# Patient Record
Sex: Male | Born: 1940 | Race: White | Hispanic: No | Marital: Married | Smoking: Former smoker
Health system: Southern US, Community
[De-identification: ages and names within clinical notes are randomized; demographics above are authoritative.]

## PROBLEM LIST (undated history)

## (undated) DIAGNOSIS — I10 Essential (primary) hypertension: Secondary | ICD-10-CM

## (undated) DIAGNOSIS — N189 Chronic kidney disease, unspecified: Secondary | ICD-10-CM

## (undated) DIAGNOSIS — E119 Type 2 diabetes mellitus without complications: Secondary | ICD-10-CM

## (undated) HISTORY — PX: LITHOTRIPSY: SUR834

---

## 2004-01-29 ENCOUNTER — Ambulatory Visit: Payer: Self-pay | Admitting: Family Medicine

## 2004-03-14 ENCOUNTER — Ambulatory Visit: Payer: Self-pay | Admitting: Family Medicine

## 2004-03-17 ENCOUNTER — Ambulatory Visit: Payer: Self-pay | Admitting: Family Medicine

## 2004-03-27 ENCOUNTER — Ambulatory Visit: Payer: Self-pay | Admitting: Family Medicine

## 2004-09-04 ENCOUNTER — Ambulatory Visit: Payer: Self-pay | Admitting: Family Medicine

## 2004-12-08 ENCOUNTER — Ambulatory Visit: Payer: Self-pay | Admitting: Family Medicine

## 2005-02-11 ENCOUNTER — Ambulatory Visit: Payer: Self-pay | Admitting: Family Medicine

## 2005-02-16 HISTORY — PX: CYSTOSCOPY/RETROGRADE/URETEROSCOPY/STONE EXTRACTION WITH BASKET: SHX5317

## 2005-09-14 ENCOUNTER — Ambulatory Visit: Payer: Self-pay | Admitting: Family Medicine

## 2005-09-16 ENCOUNTER — Ambulatory Visit (HOSPITAL_COMMUNITY): Admission: RE | Admit: 2005-09-16 | Discharge: 2005-09-16 | Payer: Self-pay | Admitting: Family Medicine

## 2005-09-21 ENCOUNTER — Ambulatory Visit (HOSPITAL_BASED_OUTPATIENT_CLINIC_OR_DEPARTMENT_OTHER): Admission: RE | Admit: 2005-09-21 | Discharge: 2005-09-21 | Payer: Self-pay | Admitting: Urology

## 2005-09-24 ENCOUNTER — Ambulatory Visit (HOSPITAL_COMMUNITY): Admission: RE | Admit: 2005-09-24 | Discharge: 2005-09-24 | Payer: Self-pay | Admitting: Urology

## 2005-10-14 ENCOUNTER — Ambulatory Visit: Payer: Self-pay | Admitting: Family Medicine

## 2005-10-20 ENCOUNTER — Ambulatory Visit (HOSPITAL_COMMUNITY): Admission: RE | Admit: 2005-10-20 | Discharge: 2005-10-20 | Payer: Self-pay | Admitting: Family Medicine

## 2005-10-20 ENCOUNTER — Ambulatory Visit: Payer: Self-pay | Admitting: Family Medicine

## 2006-01-29 ENCOUNTER — Ambulatory Visit: Payer: Self-pay | Admitting: Cardiology

## 2006-03-26 ENCOUNTER — Ambulatory Visit: Payer: Self-pay | Admitting: Family Medicine

## 2006-06-22 ENCOUNTER — Ambulatory Visit: Payer: Self-pay | Admitting: Family Medicine

## 2006-09-07 ENCOUNTER — Ambulatory Visit (HOSPITAL_BASED_OUTPATIENT_CLINIC_OR_DEPARTMENT_OTHER): Admission: RE | Admit: 2006-09-07 | Discharge: 2006-09-07 | Payer: Self-pay | Admitting: Urology

## 2006-09-09 ENCOUNTER — Ambulatory Visit (HOSPITAL_COMMUNITY): Admission: RE | Admit: 2006-09-09 | Discharge: 2006-09-09 | Payer: Self-pay | Admitting: Urology

## 2006-10-14 ENCOUNTER — Ambulatory Visit (HOSPITAL_COMMUNITY): Admission: RE | Admit: 2006-10-14 | Discharge: 2006-10-14 | Payer: Self-pay | Admitting: Urology

## 2006-11-01 ENCOUNTER — Encounter: Admission: RE | Admit: 2006-11-01 | Discharge: 2006-11-01 | Payer: Self-pay | Admitting: Family Medicine

## 2006-11-05 ENCOUNTER — Ambulatory Visit (HOSPITAL_COMMUNITY): Admission: RE | Admit: 2006-11-05 | Discharge: 2006-11-05 | Payer: Self-pay | Admitting: Urology

## 2006-11-08 ENCOUNTER — Ambulatory Visit (HOSPITAL_BASED_OUTPATIENT_CLINIC_OR_DEPARTMENT_OTHER): Admission: RE | Admit: 2006-11-08 | Discharge: 2006-11-09 | Payer: Self-pay | Admitting: Urology

## 2008-03-05 IMAGING — CR DG ABDOMEN 1V
1 series · 1 of 1 positions shown · non-contrast
Comparison: CT dated 09/16/05.

CLINICAL DATA: Right-sided nephrolithiasis.  
 ABDOMEN ? 1 VIEW:

[view not recorded]
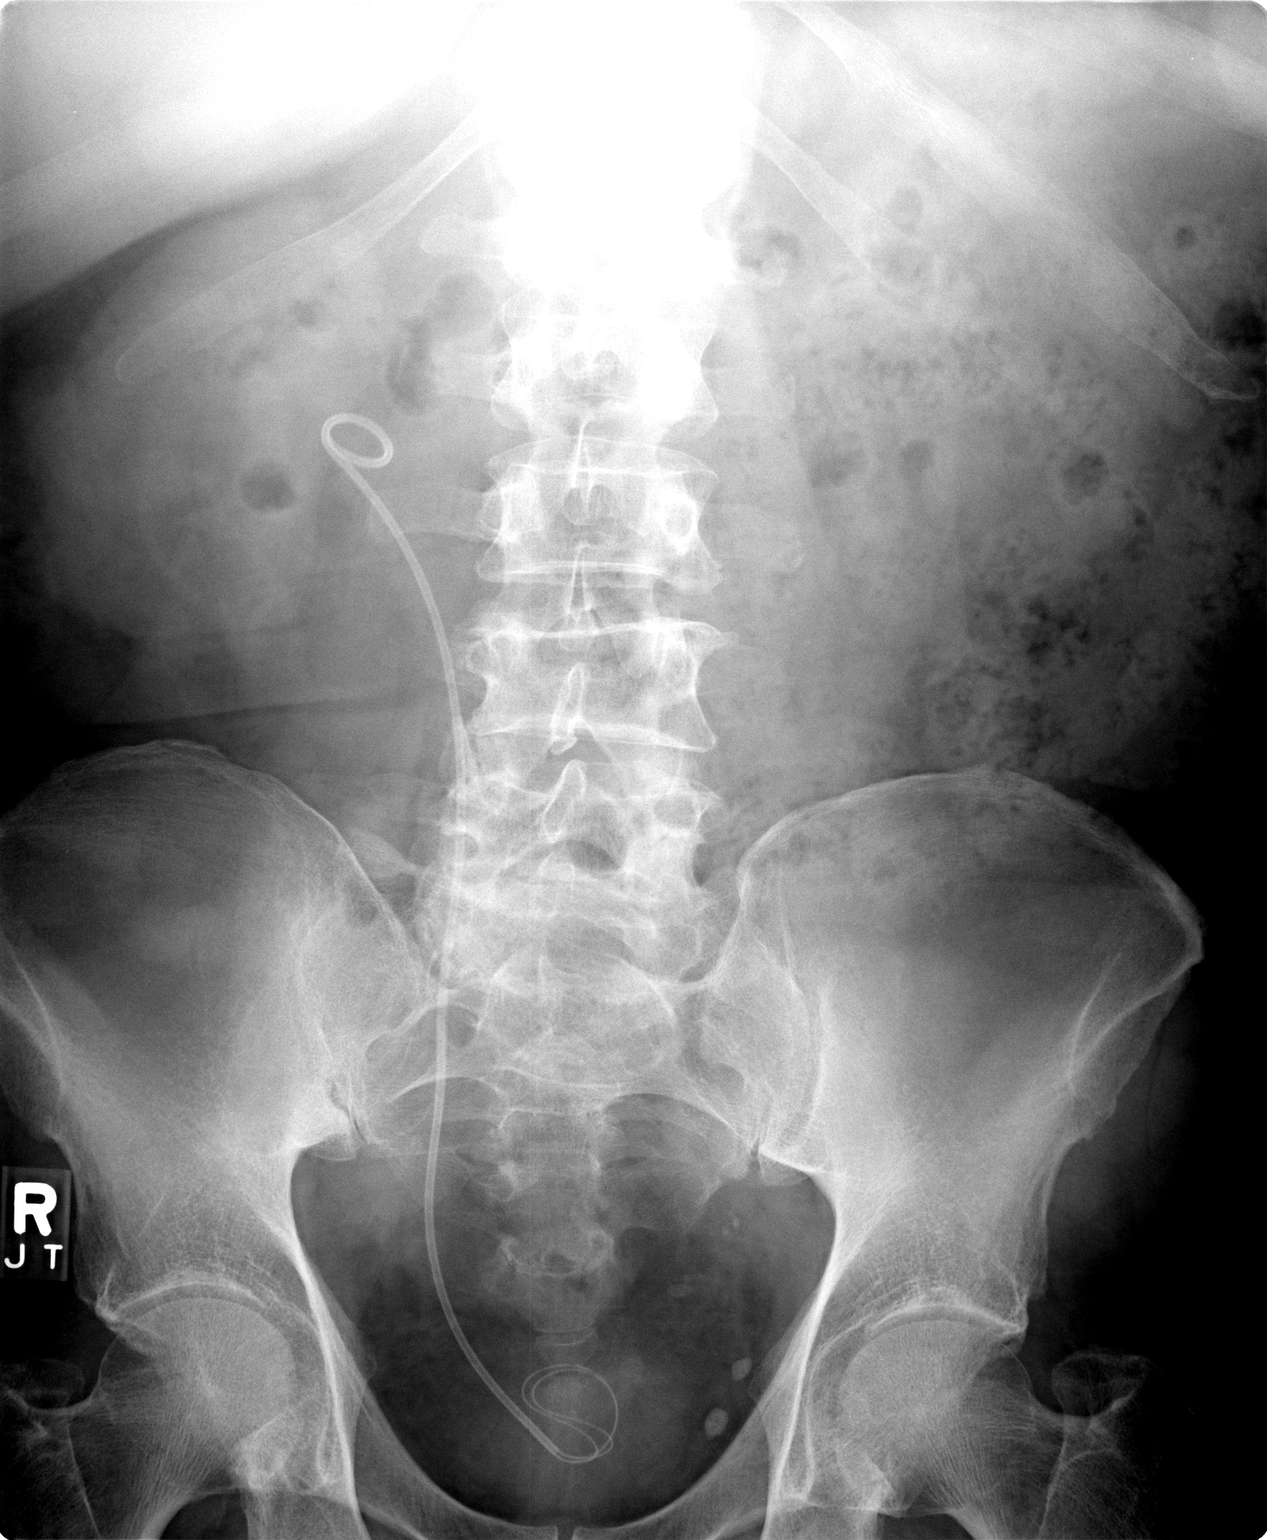

[1 of 1 positions shown; findings below may reference images not displayed]

FINDINGS: There is a right-sided nephroureteral stent in place.  
 The obstructing right ureteral stone is not identified on today?s examination.
IMPRESSION: Interval placement of right nephroureteral stent.

## 2009-03-25 IMAGING — CR DG ABDOMEN 1V
1 series · 1 of 1 positions shown · non-contrast
Comparison: 09/09/06.

CLINICAL DATA: Preop for right renal stone.  
 ABDOMEN ? 1 VIEW:

[t abdomen supine]
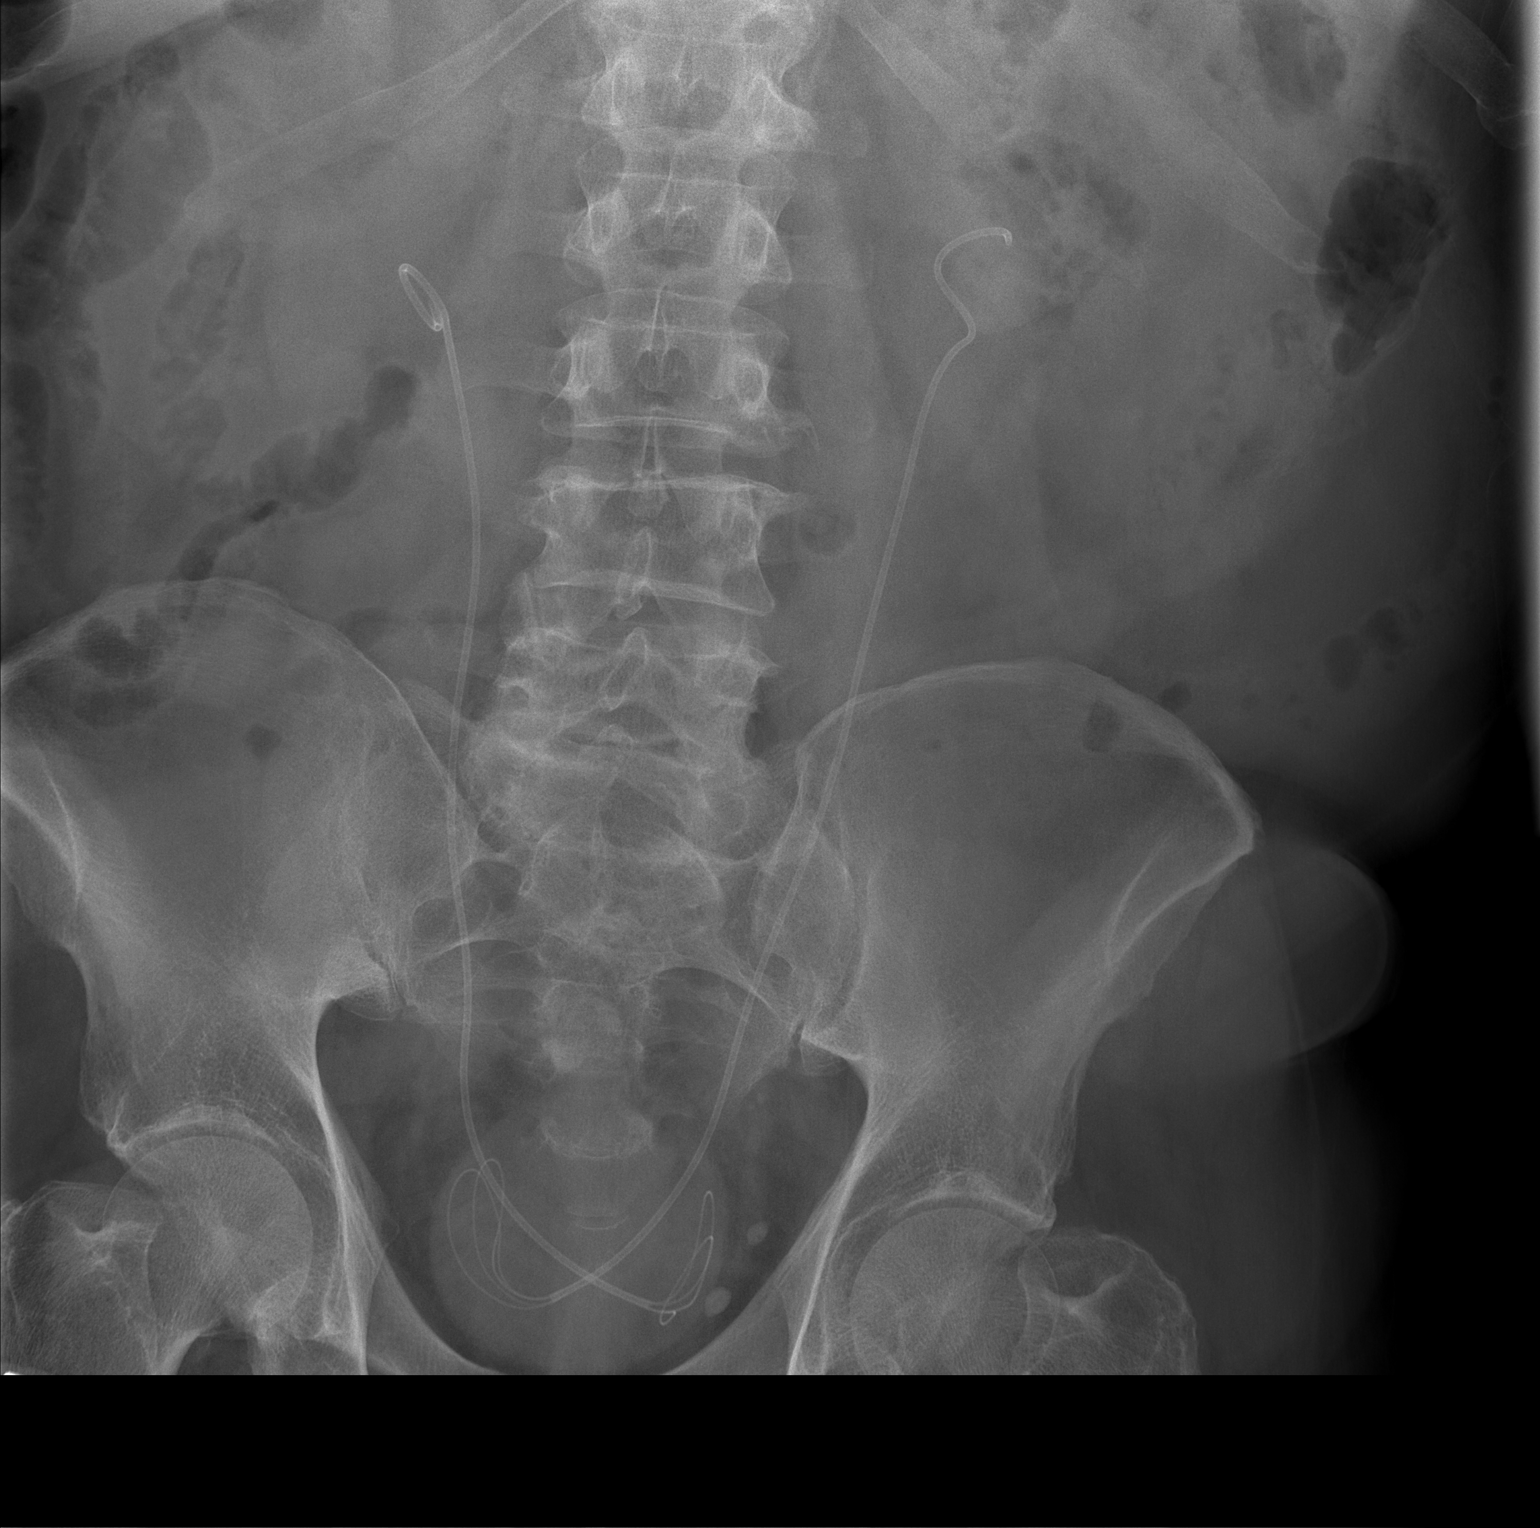

[1 of 1 positions shown; findings below may reference images not displayed]

FINDINGS: Bilateral Double-J ureteral catheters remain in place. As was questioned previously, there is suggestion of a faintly calcified large left renal calculus.  No new findings.
IMPRESSION: 1.  Probable faintly calcified left central renal calculus as before. 
 2.  Bilateral Double-J ureteral catheters remain in place.   
 3.  No new findings.

## 2010-07-01 NOTE — Op Note (Signed)
NAMELEOPOLD, SMYERS              ACCOUNT NO.:  [id number]   MEDICAL RECORD NO.:  [id number]          PATIENT TYPE:  AMB   LOCATION:  [facility]                         FACILITY:  [facility]   PHYSICIAN:  Sigmund I. Patsi Sears, M.D.DATE OF BIRTH:  May 07, 1940   DATE OF PROCEDURE:  11/08/2006  DATE OF DISCHARGE:                               OPERATIVE REPORT   PREOPERATIVE DIAGNOSIS:  Large left renal pelvic calculus.   POSTOPERATIVE DIAGNOSIS:  Large left renal pelvic calculus.   OPERATION:  Transurethral lithotripsy of large renal pelvic stone,  bilateral retrograde pyelogram with interpretation, bilateral double-J  catheter (7-French x 24-cm LithoStent (left); 5-French x 24-cm stent,  right side).   SURGEON:  Sigmund I. Patsi Sears, M.D.   ANESTHESIA:  General LMA.   PREOPERATIVE PREPARATION:  After appropriate preanesthesia, the patient  was brought to the operating room and placed on the operating room in  the dorsal supine position, where general LMA anesthesia was introduced.  He was then replaced in the dorsal lithotomy position, where the pubis  was prepped with Betadine solution and draped in the usual fashion.   HISTORY:  Mr. Darley is a 70 year old male with a history of  creatinine 3.5, and bilateral renal stones.  He is status post  lithotripsy bilaterally.  There is no identifiable right side stone, but  there is still stone to his left side.  Because his creatinine is  elevated, he will have a retrograde pyelography, double-J catheter,  ureteroscopy, and ureteroscopic lithotripsy of his stones in the left  kidney.   PROCEDURE:  Cystourethroscopy was accomplished and bilateral retrograde  pyelograms performed.  There was no definite obstruction on the right  side; I do not see any stones.  On the left side, there is a very large  left renal pelvic stone identified.  The patient is status post  lithotripsy of a 2.5-cm left renal pelvic stone.   The sheath was introduced  into the left ureter, and the Olympus digital  ureteroscope was placed into the renal pelvis.  A large left renal  calculus was identified.  This was fragmented with the laser.  Following  fragmentation with laser, chips are evacuated from the kidney, but the  chips are very tiny.  The kidney is irrigated.  It was felt that there  was a large amount of edema noted in the kidney, and it was elected to  place a double-J catheter, for approximately 6 weeks, and allow the  edema to subside.  I will then ureteroscope the patient, and see if  there is any stone material  left in his left kidney.  He may need a right ureteroscopy as well.  We  will follow his creatinine.  He may need Nephrology consultation.  After  double-J catheters were placed, the patient was awakened and taken to  the recovery room in good condition.      Sigmund I. Patsi Sears, M.D.  Electronically Signed     SIT/MEDQ  D:  11/08/2006  T:  11/09/2006  Job:  [id number]

## 2010-07-01 NOTE — Op Note (Signed)
[name], Kevin Jenkins              ACCOUNT NO.:  [id number]   MEDICAL RECORD NO.:  [id number]          PATIENT TYPE:  AMB   LOCATION:  [facility]                         FACILITY:  [facility]   PHYSICIAN:  Sigmund I. Patsi Sears, M.D.DATE OF BIRTH:  [birth year]   DATE OF PROCEDURE:  09/07/2006  DATE OF DISCHARGE:                               OPERATIVE REPORT   PREOPERATIVE DIAGNOSIS:  Bilateral large renal calculi.   POSTOPERATIVE DIAGNOSIS:  Bilateral large renal calculi.   OPERATION:  Cystourethroscopy, bilateral retrograde pyelogram with  interpretation, bilateral double-J catheter (right side 26 cm x 5 French  Polaris; left side 24 cm x 5 [place] Polaris).   SURGEON:  Sigmund I. Patsi Sears, M.D.   ANESTHESIA:  General LMA.   PREPARATION:  Appropriate preanesthesia, the patient is brought to the  operating room and placed on the operating room table in the dorsal  supine position where general LMA anesthesia was induced.  He was then  replaced in the dorsal lithotomy position where the pubis was prepped  with Betadine solution and draped in the usual fashion.   REVIEW OF HISTORY:  This 70 year old male has known large bilateral  renal calculi and is for double J catheterization, prior to lithotripsy.   PROCEDURE:  Cystoscopy revealed that the patient has a normal appearing  urethra with no stricture.  The prostate fossa was normal.  The bladder  neck is normal.  The bladder, itself, shows no evidence of bladder  stone, tumor, or diverticular formation.  There is much granular stone  material within the bladder.  Retrograde pyelograms were performed  bilaterally which shows no hydronephrosis, but there were stones  bilaterally.  On the left side, there is a large 25 mm stone filling the  entire left renal pelvis.  On the right side, there are several smaller  stones in the right lower pole and in the infundibulum.  Guidewires were  passed bilaterally and on the right side, a 5 [place] x  26 cm double J is  passed in this 6 foot individual.  It was felt that this was possibly  too long a double J, and therefore, a 24 cm x 5 [place] was passed on the  left side into the upper pole around stone and into the bladder.  The  patient tolerated the procedure well.  He was given a B&O suppository at  the beginning of case.  No Toradol was given.  The cystoscope was  removed.      Sigmund I. Patsi Sears, M.D.  Electronically Signed     SIT/MEDQ  D:  09/07/2006  T:  09/07/2006  Job:  [id number]

## 2010-07-04 NOTE — Op Note (Signed)
NAMEHASANI, Kevin Jenkins              ACCOUNT NO.:  [id number]   MEDICAL RECORD NO.:  [id number]          PATIENT TYPE:  AMB   LOCATION:  [facility]                         [facility]ACILITY:  [facility]   PHYSICIAN:  Sigmund I. Patsi Sears, M.D.DATE OF BIRTH:  08/29/1940   DATE OF PROCEDURE:  09/21/2005  DATE OF DISCHARGE:                                 OPERATIVE REPORT   PREOPERATIVE DIAGNOSIS:  Impacted right ureteropelvic junction stone.   POSTOPERATIVE DIAGNOSIS:  Impacted right ureteropelvic junction stone with  hydronephrosis.   OPERATIONS:  Cystourethroscopy, right retrograde pyelogram with  interpretation, right double-J catheter passage.   SURGEON:  Sigmund I. Patsi Sears, M.D.   ANESTHESIA:  General LMA.   PREPARATION:  After appropriate preanesthesia, the patient is brought to the  operating room, placed on the operating table in dorsal supine position  where general LMA anesthesia was introduced.  He was then replaced in dorsal  lithotomy position where the pubis was prepped with Betadine solution and  draped in the usual fashion.   REVIEW OF HISTORY:  This 70 year old male, has a history of right UPJ  obstruction secondary to an 8 mm stone, with proximal hydronephrosis.  He is  now for double-J catheter placement and will be for lithotripsy later this  week.   DESCRIPTION OF PROCEDURE:  Cystourethroscopy was accomplished, and shows a  normal-appearing urethra with no stricture disease.  The prostate and the  prostatic lobes are mildly hypertrophied.  There was mild trabeculation but  no cellule formation within the bladder.  There was no bladder stone or  tumor or diverticular formation.   The right orifice could not be identified easily, so 1 ampule of indigo  carmine was given.  The right orifice was then identified within the  trigone, and normal, in position, and the 6 open-ended catheter was passed  into the ureter.  Retrograde pyelography was then performed which shows a  normal-appearing right lower ureter and upper ureter.  At the level of the  UPJ, there is an 8 mm stone, with obstruction, and proximal hydronephrosis.  A guidewire was passed around the obstruction into the renal pelvis, and  following this, a 6 x 26 cm Polaris stent is placed.  The position of the  stent is right renal pelvis, and the stent extends into the bladder.  The  patient tolerated the procedure well.  He was given IV Toradol, awakened and  taken to the recovery room in good condition.      Sigmund I. Patsi Sears, M.D.  Electronically Signed     SIT/MEDQ  D:  09/21/2005  T:  09/21/2005  Job:  [id number]

## 2010-11-27 LAB — POCT I-STAT 4, (NA,K, GLUC, HGB,HCT)
Hemoglobin: 11.9 — ABNORMAL LOW
Potassium: 4.6

## 2010-11-28 LAB — BASIC METABOLIC PANEL
Chloride: 105
Creatinine, Ser: 3.52 — ABNORMAL HIGH
GFR calc Af Amer: 21 — ABNORMAL LOW

## 2010-12-01 LAB — POCT I-STAT 4, (NA,K, GLUC, HGB,HCT)
HCT: 41
Operator id: 268271
Sodium: 136

## 2014-05-14 ENCOUNTER — Other Ambulatory Visit: Payer: Self-pay | Admitting: Urology

## 2014-05-14 ENCOUNTER — Encounter (HOSPITAL_COMMUNITY): Payer: Self-pay | Admitting: *Deleted

## 2014-05-14 NOTE — Progress Notes (Addendum)
Late note called for release of orders to Epic Same surgery 05-15-14

## 2014-05-15 ENCOUNTER — Encounter (HOSPITAL_COMMUNITY): Payer: Self-pay | Admitting: Certified Registered Nurse Anesthetist

## 2014-05-15 ENCOUNTER — Encounter (HOSPITAL_COMMUNITY): Payer: Self-pay | Admitting: *Deleted

## 2014-05-15 ENCOUNTER — Encounter (HOSPITAL_COMMUNITY)
Admission: RE | Disposition: A | Discharge status: Home / Self Care | Payer: Self-pay | Source: Ambulatory Visit | Attending: Internal Medicine

## 2014-05-15 ENCOUNTER — Inpatient Hospital Stay (HOSPITAL_COMMUNITY): Payer: Medicare Other

## 2014-05-15 ENCOUNTER — Inpatient Hospital Stay (HOSPITAL_COMMUNITY)
Admission: RE | Admit: 2014-05-15 | Discharge: 2014-05-20 | DRG: 660 | Disposition: A | Discharge status: Home / Self Care | Payer: Medicare Other | Source: Ambulatory Visit | Attending: Internal Medicine | Admitting: Internal Medicine

## 2014-05-15 DIAGNOSIS — E119 Type 2 diabetes mellitus without complications: Secondary | ICD-10-CM | POA: Diagnosis present

## 2014-05-15 DIAGNOSIS — Z803 Family history of malignant neoplasm of breast: Secondary | ICD-10-CM

## 2014-05-15 DIAGNOSIS — Z87891 Personal history of nicotine dependence: Secondary | ICD-10-CM

## 2014-05-15 DIAGNOSIS — Z79899 Other long term (current) drug therapy: Secondary | ICD-10-CM | POA: Diagnosis not present

## 2014-05-15 DIAGNOSIS — N189 Chronic kidney disease, unspecified: Secondary | ICD-10-CM

## 2014-05-15 DIAGNOSIS — E1121 Type 2 diabetes mellitus with diabetic nephropathy: Secondary | ICD-10-CM | POA: Diagnosis not present

## 2014-05-15 DIAGNOSIS — N390 Urinary tract infection, site not specified: Secondary | ICD-10-CM | POA: Diagnosis present

## 2014-05-15 DIAGNOSIS — E872 Acidosis, unspecified: Secondary | ICD-10-CM | POA: Diagnosis present

## 2014-05-15 DIAGNOSIS — N529 Male erectile dysfunction, unspecified: Secondary | ICD-10-CM | POA: Diagnosis present

## 2014-05-15 DIAGNOSIS — E559 Vitamin D deficiency, unspecified: Secondary | ICD-10-CM | POA: Diagnosis present

## 2014-05-15 DIAGNOSIS — N179 Acute kidney failure, unspecified: Secondary | ICD-10-CM | POA: Diagnosis not present

## 2014-05-15 DIAGNOSIS — Z87442 Personal history of urinary calculi: Secondary | ICD-10-CM | POA: Diagnosis not present

## 2014-05-15 DIAGNOSIS — D72829 Elevated white blood cell count, unspecified: Secondary | ICD-10-CM | POA: Diagnosis present

## 2014-05-15 DIAGNOSIS — Z8042 Family history of malignant neoplasm of prostate: Secondary | ICD-10-CM | POA: Diagnosis not present

## 2014-05-15 DIAGNOSIS — I10 Essential (primary) hypertension: Secondary | ICD-10-CM | POA: Diagnosis not present

## 2014-05-15 DIAGNOSIS — F439 Reaction to severe stress, unspecified: Secondary | ICD-10-CM | POA: Diagnosis present

## 2014-05-15 DIAGNOSIS — I129 Hypertensive chronic kidney disease with stage 1 through stage 4 chronic kidney disease, or unspecified chronic kidney disease: Secondary | ICD-10-CM | POA: Diagnosis present

## 2014-05-15 DIAGNOSIS — E860 Dehydration: Secondary | ICD-10-CM | POA: Diagnosis present

## 2014-05-15 DIAGNOSIS — Z91041 Radiographic dye allergy status: Secondary | ICD-10-CM

## 2014-05-15 DIAGNOSIS — T445X5A Adverse effect of predominantly beta-adrenoreceptor agonists, initial encounter: Secondary | ICD-10-CM | POA: Diagnosis present

## 2014-05-15 DIAGNOSIS — I771 Stricture of artery: Secondary | ICD-10-CM | POA: Diagnosis present

## 2014-05-15 DIAGNOSIS — Z7982 Long term (current) use of aspirin: Secondary | ICD-10-CM

## 2014-05-15 DIAGNOSIS — Z833 Family history of diabetes mellitus: Secondary | ICD-10-CM | POA: Diagnosis not present

## 2014-05-15 DIAGNOSIS — N132 Hydronephrosis with renal and ureteral calculous obstruction: Principal | ICD-10-CM | POA: Diagnosis present

## 2014-05-15 DIAGNOSIS — N184 Chronic kidney disease, stage 4 (severe): Secondary | ICD-10-CM | POA: Diagnosis not present

## 2014-05-15 DIAGNOSIS — N201 Calculus of ureter: Secondary | ICD-10-CM

## 2014-05-15 DIAGNOSIS — Z8249 Family history of ischemic heart disease and other diseases of the circulatory system: Secondary | ICD-10-CM

## 2014-05-15 DIAGNOSIS — E875 Hyperkalemia: Secondary | ICD-10-CM | POA: Diagnosis present

## 2014-05-15 DIAGNOSIS — R109 Unspecified abdominal pain: Secondary | ICD-10-CM | POA: Diagnosis present

## 2014-05-15 HISTORY — DX: Essential (primary) hypertension: I10

## 2014-05-15 HISTORY — DX: Type 2 diabetes mellitus without complications: E11.9

## 2014-05-15 HISTORY — DX: Chronic kidney disease, unspecified: N18.9

## 2014-05-15 LAB — GLUCOSE, CAPILLARY
Glucose-Capillary: 140 mg/dL — ABNORMAL HIGH (ref 70–99)
Glucose-Capillary: 126 mg/dL — ABNORMAL HIGH (ref 70–99)

## 2014-05-15 LAB — COMPREHENSIVE METABOLIC PANEL
ALBUMIN: 4 g/dL (ref 3.5–5.2)
ALT: 15 U/L (ref 0–53)
ANION GAP: 13 (ref 5–15)
AST: 14 U/L (ref 0–37)
Alkaline Phosphatase: 61 U/L (ref 39–117)
BUN: 118 mg/dL — ABNORMAL HIGH (ref 6–23)
CHLORIDE: 109 mmol/L (ref 96–112)
CO2: 20 mmol/L (ref 19–32)
Calcium: 9.7 mg/dL (ref 8.4–10.5)
Creatinine, Ser: 8.24 mg/dL — ABNORMAL HIGH (ref 0.50–1.35)
GFR calc Af Amer: 7 mL/min — ABNORMAL LOW (ref >90)
GFR, EST NON AFRICAN AMERICAN: 6 mL/min — AB (ref >90)
Glucose, Bld: 152 mg/dL — ABNORMAL HIGH (ref 70–99)
Potassium: 6.2 mmol/L (ref 3.5–5.1)
Sodium: 142 mmol/L (ref 135–145)
Total Bilirubin: 0.4 mg/dL (ref 0.3–1.2)
Total Protein: 7.6 g/dL (ref 6.0–8.3)

## 2014-05-15 LAB — CBC
HCT: 43.5 % (ref 39.0–52.0)
HCT: 43.3 % (ref 39.0–52.0)
HEMOGLOBIN: 14.1 g/dL (ref 13.0–17.0)
HEMOGLOBIN: 13.9 g/dL (ref 13.0–17.0)
MCH: 29.4 pg (ref 26.0–34.0)
MCH: 29.3 pg (ref 26.0–34.0)
MCHC: 32.4 g/dL (ref 30.0–36.0)
MCHC: 32.1 g/dL (ref 30.0–36.0)
MCV: 90.8 fL (ref 78.0–100.0)
MCV: 91.2 fL (ref 78.0–100.0)
PLATELETS: 262 10*3/uL (ref 150–400)
Platelets: 271 10*3/uL (ref 150–400)
RBC: 4.79 MIL/uL (ref 4.22–5.81)
RBC: 4.75 MIL/uL (ref 4.22–5.81)
RDW: 13 % (ref 11.5–15.5)
RDW: 13 % (ref 11.5–15.5)
WBC: 14.7 10*3/uL — ABNORMAL HIGH (ref 4.0–10.5)
WBC: 15.2 10*3/uL — ABNORMAL HIGH (ref 4.0–10.5)

## 2014-05-15 LAB — PROTIME-INR
INR: 1.13 (ref 0.00–1.49)
Prothrombin Time: 14.6 seconds (ref 11.6–15.2)

## 2014-05-15 LAB — PHOSPHORUS: Phosphorus: 8.9 mg/dL — ABNORMAL HIGH (ref 2.3–4.6)

## 2014-05-15 LAB — MAGNESIUM: Magnesium: 2.6 mg/dL — ABNORMAL HIGH (ref 1.5–2.5)

## 2014-05-15 LAB — POCT I-STAT 4, (NA,K, GLUC, HGB,HCT)
Glucose, Bld: 128 mg/dL — ABNORMAL HIGH (ref 70–99)
HCT: 44 % (ref 39.0–52.0)
Hemoglobin: 15 g/dL (ref 13.0–17.0)
Potassium: 7.1 mmol/L (ref 3.5–5.1)
Sodium: 136 mmol/L (ref 135–145)

## 2014-05-15 LAB — APTT: APTT: 38 s — AB (ref 24–37)

## 2014-05-15 SURGERY — CYSTOURETEROSCOPY, WITH RETROGRADE PYELOGRAM AND STENT INSERTION
Anesthesia: General | Laterality: Right

## 2014-05-15 MED ORDER — CEFAZOLIN SODIUM-DEXTROSE 2-3 GM-% IV SOLR
INTRAVENOUS | Status: AC
  Filled 2014-05-15: qty 50

## 2014-05-15 MED ORDER — ASPIRIN EC 81 MG PO TBEC
81.0000 mg | DELAYED_RELEASE_TABLET | Freq: Every day | ORAL | Status: DC
  Administered 2014-05-17 – 2014-05-20 (×4): 81 mg via ORAL
  Filled 2014-05-15 (×4): qty 1

## 2014-05-15 MED ORDER — SODIUM CHLORIDE 0.9 % IJ SOLN
3.0000 mL | Freq: Two times a day (BID) | INTRAMUSCULAR | Status: DC
  Administered 2014-05-17 – 2014-05-18 (×2): 3 mL via INTRAVENOUS

## 2014-05-15 MED ORDER — MEPERIDINE HCL 50 MG/ML IJ SOLN: 6.2500 mg | INTRAMUSCULAR | Status: DC | PRN

## 2014-05-15 MED ORDER — SODIUM BICARBONATE 8.4 % IV SOLN
INTRAVENOUS | Status: DC
  Administered 2014-05-15: 21:00:00 via INTRAVENOUS
  Filled 2014-05-15: qty 100

## 2014-05-15 MED ORDER — SODIUM POLYSTYRENE SULFONATE 15 GM/60ML PO SUSP
30.0000 g | Freq: Once | ORAL | Status: AC
  Administered 2014-05-15: 30 g via ORAL
  Filled 2014-05-15: qty 120

## 2014-05-15 MED ORDER — LACTATED RINGERS IV SOLN
INTRAVENOUS | Status: DC
  Administered 2014-05-15: 1000 mL via INTRAVENOUS

## 2014-05-15 MED ORDER — MIDAZOLAM HCL 2 MG/2ML IJ SOLN: 0.5000 mg | Freq: Once | INTRAMUSCULAR | Status: DC | PRN

## 2014-05-15 MED ORDER — FENTANYL CITRATE 0.05 MG/ML IJ SOLN: 25.0000 ug | INTRAMUSCULAR | Status: DC | PRN

## 2014-05-15 MED ORDER — SODIUM BICARBONATE 8.4 % IV SOLN
50.0000 meq | Freq: Once | INTRAVENOUS | Status: AC
  Administered 2014-05-15: 50 meq via INTRAVENOUS
  Filled 2014-05-15 (×2): qty 50

## 2014-05-15 MED ORDER — INSULIN ASPART 100 UNIT/ML IV SOLN
15.0000 [IU] | Freq: Once | INTRAVENOUS | Status: AC
  Administered 2014-05-15: 15 [IU] via INTRAVENOUS
  Filled 2014-05-15: qty 0.15

## 2014-05-15 MED ORDER — INSULIN ASPART 100 UNIT/ML ~~LOC~~ SOLN
0.0000 [IU] | Freq: Three times a day (TID) | SUBCUTANEOUS | Status: DC
  Administered 2014-05-16: 3 [IU] via SUBCUTANEOUS
  Administered 2014-05-17: 2 [IU] via SUBCUTANEOUS
  Administered 2014-05-17: 3 [IU] via SUBCUTANEOUS
  Administered 2014-05-17: 2 [IU] via SUBCUTANEOUS
  Administered 2014-05-18: 3 [IU] via SUBCUTANEOUS
  Administered 2014-05-18: 5 [IU] via SUBCUTANEOUS
  Administered 2014-05-19: 3 [IU] via SUBCUTANEOUS
  Administered 2014-05-19 – 2014-05-20 (×3): 2 [IU] via SUBCUTANEOUS

## 2014-05-15 MED ORDER — ALBUTEROL SULFATE (2.5 MG/3ML) 0.083% IN NEBU: 10.0000 mg | INHALATION_SOLUTION | Freq: Once | RESPIRATORY_TRACT | Status: DC

## 2014-05-15 MED ORDER — CEFAZOLIN SODIUM-DEXTROSE 2-3 GM-% IV SOLR: 2.0000 g | INTRAVENOUS | Status: DC

## 2014-05-15 MED ORDER — ONDANSETRON HCL 4 MG/2ML IJ SOLN: 4.0000 mg | Freq: Four times a day (QID) | INTRAMUSCULAR | Status: DC | PRN

## 2014-05-15 MED ORDER — OXYCODONE HCL 5 MG PO TABS
5.0000 mg | ORAL_TABLET | ORAL | Status: DC | PRN
  Administered 2014-05-18: 5 mg via ORAL
  Filled 2014-05-15: qty 1

## 2014-05-15 MED ORDER — ISOSORB DINITRATE-HYDRALAZINE 20-37.5 MG PO TABS
1.0000 | ORAL_TABLET | Freq: Two times a day (BID) | ORAL | Status: DC
  Administered 2014-05-15: 1 via ORAL
  Filled 2014-05-15: qty 1

## 2014-05-15 MED ORDER — PROMETHAZINE HCL 25 MG/ML IJ SOLN: 6.2500 mg | INTRAMUSCULAR | Status: DC | PRN

## 2014-05-15 MED ORDER — NICOTINE 14 MG/24HR TD PT24
14.0000 mg | MEDICATED_PATCH | Freq: Every day | TRANSDERMAL | Status: DC
  Filled 2014-05-15: qty 1

## 2014-05-15 MED ORDER — HEPARIN SODIUM (PORCINE) 5000 UNIT/ML IJ SOLN
5000.0000 [IU] | Freq: Three times a day (TID) | INTRAMUSCULAR | Status: DC
  Administered 2014-05-15 – 2014-05-16 (×2): 5000 [IU] via SUBCUTANEOUS
  Filled 2014-05-15 (×2): qty 1

## 2014-05-15 MED ORDER — ONDANSETRON HCL 4 MG PO TABS: 4.0000 mg | ORAL_TABLET | Freq: Four times a day (QID) | ORAL | Status: DC | PRN

## 2014-05-15 MED ORDER — PROPOFOL 10 MG/ML IV BOLUS
INTRAVENOUS | Status: AC
  Filled 2014-05-15: qty 20

## 2014-05-15 MED ORDER — POLYETHYLENE GLYCOL 3350 17 G PO PACK
17.0000 g | PACK | Freq: Every day | ORAL | Status: DC
  Administered 2014-05-17: 17 g via ORAL
  Filled 2014-05-15 (×2): qty 1

## 2014-05-15 MED ORDER — LABETALOL HCL 5 MG/ML IV SOLN
5.0000 mg | Freq: Four times a day (QID) | INTRAVENOUS | Status: DC | PRN
  Administered 2014-05-16 – 2014-05-18 (×2): 5 mg via INTRAVENOUS
  Filled 2014-05-15 (×2): qty 4

## 2014-05-15 MED ORDER — ACETAMINOPHEN 325 MG PO TABS: 650.0000 mg | ORAL_TABLET | Freq: Four times a day (QID) | ORAL | Status: DC | PRN

## 2014-05-15 MED ORDER — CALCIUM GLUCONATE 10 % IV SOLN
1.0000 g | Freq: Once | INTRAVENOUS | Status: AC
  Administered 2014-05-15: 1 g via INTRAVENOUS
  Filled 2014-05-15 (×2): qty 10

## 2014-05-15 MED ORDER — ACETAMINOPHEN 650 MG RE SUPP
650.0000 mg | Freq: Four times a day (QID) | RECTAL | Status: DC | PRN
  Filled 2014-05-15: qty 1

## 2014-05-15 MED ORDER — CEFTRIAXONE SODIUM IN DEXTROSE 20 MG/ML IV SOLN
1.0000 g | INTRAVENOUS | Status: DC
  Administered 2014-05-15 – 2014-05-16 (×2): 1 g via INTRAVENOUS
  Filled 2014-05-15 (×3): qty 50

## 2014-05-15 SURGICAL SUPPLY — 18 items
BAG URO CATCHER STRL LF (DRAPE) ×4 IMPLANT
CATH INTERMIT  6FR 70CM (CATHETERS) ×4 IMPLANT
CLOTH BEACON ORANGE TIMEOUT ST (SAFETY) ×4 IMPLANT
FIBER LASER FLEXIVA 1000 (UROLOGICAL SUPPLIES) IMPLANT
FIBER LASER FLEXIVA 200 (UROLOGICAL SUPPLIES) IMPLANT
FIBER LASER FLEXIVA 365 (UROLOGICAL SUPPLIES) IMPLANT
FIBER LASER FLEXIVA 550 (UROLOGICAL SUPPLIES) IMPLANT
FIBER LASER TRAC TIP (UROLOGICAL SUPPLIES) IMPLANT
GLOVE BIOGEL M STRL SZ7.5 (GLOVE) ×4 IMPLANT
GOWN STRL REUS W/TWL XL LVL3 (GOWN DISPOSABLE) ×4 IMPLANT
GUIDEWIRE STR DUAL SENSOR (WIRE) ×4 IMPLANT
MANIFOLD NEPTUNE II (INSTRUMENTS) ×4 IMPLANT
NS IRRIG 1000ML POUR BTL (IV SOLUTION) ×4 IMPLANT
PACK CYSTO (CUSTOM PROCEDURE TRAY) ×4 IMPLANT
SCRUB PCMX 4 OZ (MISCELLANEOUS) ×4 IMPLANT
SHIELD EYE BINOCULAR (MISCELLANEOUS) IMPLANT
TUBING CONNECTING 10 (TUBING) ×3 IMPLANT
TUBING CONNECTING 10' (TUBING) ×1

## 2014-05-15 NOTE — Consult Note (Signed)
Active Problems  Problems  1. 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 Renal insufficiency (N28.9)   Assessed By: Jethro Bolusannenbaum, Sigmund (Urology); Last Assessed: 01 May 2013  Ureteral calculus, right (592.1) (N20.1)   - Assessed By: Alfredo MartinezMacDiarmid, Scott (Urology); Last Assessed: 10 May 2014   History of Present Illness     74 YO male presents for definitive treatment of his 1.3 cm distal left ureteral stone. He was last seen by Jetta Loutiane Warden, NP on 05/08/14 for a 2 week f/u. He was seen by Dr. Retta Dionesahlstedt on 04/26/14 as a work in for right flank pain, nausea and vomiting. This is lasted a day or 2. He has had no fever, chills, left-sided pain. He denied any gross hematuria or dysuria.   Hx of recurrent urolithiasis, renal insufficiency & Vit D deficiency. He is currently taking Potassium Citrate 10meq 2 PO TID & Citric-Acid solution. He is voiding well & has nocturia x 1.  03/18/12 PSA - 0.44   He was found to have a K of 7.5 in pre-op holding. Repeat was 7.1. EKG without peaked T-waves or other arrhythmias.  Past Medical History Problems  1. History of hypertension (Z86.79) 2. History of kidney stones (Z61.096(Z87.442)  Surgical History Problems  1. History of Kidney Surgery  Current Meds 1. Accupril 10 MG Oral Tablet;  Therapy: (Recorded:15Jul2008) to Recorded 2. Aspirin 81 MG Oral Tablet Chewable;  Therapy: (Recorded:15Jul2008) to Recorded 3. Citric Acid-Sodium Citrate 334-500 MG/5ML Oral Solution; TAKE 60 ML Every twelve  hours;  Therapy: 05Mar2014 to (Evaluate:10Mar2016)  Requested for: 16Mar2015; Last  Rx:16Mar2015 Ordered 4. GlipiZIDE XL 10 MG Oral Tablet Extended Release 24 Hour;  Therapy: 05Sep2013 to Recorded 5. Oxycodone-Acetaminophen 7.5-325 MG Oral Tablet; TAKE 1 TABLET AT BEDTIME AS  NEEDED FOR PAIN;  Therapy: 10Mar2016 to (Evaluate:09Apr2016); Last Rx:10Mar2016 Ordered 6. Potassium Citrate ER 10 MEQ (1080 MG) Oral Tablet Extended Release; Take 2  tablets  with each meal as directed;  Therapy: 11Aug2008 to (Last Rx:05Oct2015)  Requested for: 05Oct2015 Ordered 7. Tamsulosin HCl - 0.4 MG Oral Capsule; TAKE 1 CAPSULE Daily;  Therapy: 10Mar2016 to (Last Rx:10Mar2016)  Requested for: 10Mar2016 Ordered  Allergies Medication  1. No Known Drug Allergies  Family History Problems  1. Family history of Breast Cancer : Mother 2. Family history of Family Health Status Number Of Children   1 son 2 daughters 3. Family history of Nephrolithiasis : Mother 4. Family history of Nephrolithiasis : Father 5. Family history of Prostate Cancer : Father  Social History Problems  1. Denied: Alcohol Use 2. Marital History - Currently Married 3. Never A Smoker 4. 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 Denied: Tobacco Use  Review of Systems Genitourinary, constitutional, skin, eye, otolaryngeal, hematologic/lymphatic, cardiovascular, pulmonary, endocrine, musculoskeletal, gastrointestinal, neurological and psychiatric system(s) were reviewed and pertinent findings if present are noted and are otherwise negative.  Genitourinary: nocturia and hematuria, but no urinary frequency, no feelings of urinary urgency, urine stream is not weak, urinary stream does not start and stop, no incomplete emptying of bladder and initiating urination does not require straining.  Gastrointestinal: no nausea, no vomiting, no heartburn, no diarrhea and no constipation.  Constitutional: no fever, no night sweats and not feeling tired (fatigue).  Integumentary: no new skin rashes or lesions and no pruritus.  Eyes: no blurred vision and no diplopia.  ENT: no sore throat and no sinus problems.  Hematologic/Lymphatic: no tendency to easily bruise and no swollen glands.  Cardiovascular: no chest pain and  no leg swelling.  Respiratory: no shortness of breath and no cough.  Endocrine: no polydipsia.  Musculoskeletal: no back pain and no joint pain.  Neurological: no headache and  no dizziness.  Psychiatric: no anxiety and no depression.    Vitals Vital Signs [Data Includes: Last 1 Day]  Recorded: 28Mar2016 03:42PM  Blood Pressure: 156 / 100 Temperature: 97.2 F Heart Rate: 92  Physical Exam Constitutional: Well nourished and well developed . No acute distress.  ENT:. The ears and nose are normal in appearance.  Neck: The appearance of the neck is normal and no neck mass is present.  Pulmonary: No respiratory distress.  Cardiovascular:. No peripheral edema.  Abdomen: The abdomen is mildly obese. The abdomen is soft and nontender. No masses are palpated. No CVA tenderness. No hernias are palpable. No hepatosplenomegaly noted.  Genitourinary: Examination of the penis demonstrates no discharge, no masses, no lesions and a normal meatus. The scrotum is without lesions. The right epididymis is palpably normal and non-tender. The left epididymis is palpably normal and non-tender. The right testis is non-tender and without masses. The left testis is non-tender and without masses.  Lymphatics: The femoral and inguinal nodes are not enlarged or tender.  Skin: Normal skin turgor, no visible rash and no visible skin lesions.  Neuro/Psych:. Mood and affect are appropriate.    Results/Data Urine [Data Includes: Last 1 Day]   28Mar2016  COLOR YELLOW   APPEARANCE CLEAR   SPECIFIC GRAVITY 1.010   pH 5.0   GLUCOSE NEG mg/dL  BILIRUBIN NEG   KETONE NEG mg/dL  BLOOD NEG   PROTEIN 30 mg/dL  UROBILINOGEN 0.2 mg/dL  NITRITE NEG   LEUKOCYTE ESTERASE NEG   SQUAMOUS EPITHELIAL/HPF RARE   WBC NONE SEEN WBC/hpf  RBC 0-2 RBC/hpf  BACTERIA NONE SEEN   CRYSTALS NONE SEEN   CASTS NONE SEEN    Old records or history reviewed:Marland Kitchen  The following images/tracing/specimen were independently visualized: Marland Kitchen  The following clinical lab reports were reviewed: Marland Kitchen  The following medical tests were reviewed:Marland Kitchen  The following radiology reports were reviewed:Marland Kitchen  Will request old records/history:.   Discussed: . Selected Results  AU CT-STONE PROTOCOL 22Mar2016 12:00AM Jetta Lout   Test Name Result Flag Reference  CT-STONE PROTOCOL (Report)    ** RADIOLOGY REPORT BY [facility] **   CLINICAL DATA: Right flank pain intermittently for the past couple weeks. History of renal insufficiency. Nausea and vomiting. Prior lithotripsy for renal stones.  EXAM: CT ABDOMEN AND PELVIS WITHOUT CONTRAST  TECHNIQUE: Multidetector CT imaging of the abdomen and pelvis was performed following the standard protocol without IV contrast.  COMPARISON: Renal ultrasound 04/26/2014. Prior CT of 05/01/2013.  FINDINGS: Lower chest: Clear lung bases. No pleural fluid.  Hepatobiliary: Liver, spleen, and stomach partially excluded. Mild hepatic steatosis. Normal gallbladder, without biliary ductal dilatation.  Pancreas: Normal, without mass or ductal dilatation.  Spleen: Normal  Adrenals/Urinary Tract: Normal adrenal glands. Moderate left renal atrophy is similar. Small stones in the inter and lower pole right renal collecting systems. An exophytic lower pole right renal lesion measures 6.4 cm and fluid density, most likely a cyst. Slightly enlarged from on the prior exam.  Moderate right-sided hydroureteronephrosis. This continues to the level of a distal right ureteric stone which measures 1.0 cm on transverse image 51 and coronal image 90. 13 mm on sagittal image 106. No bladder calculi.  Stomach/Bowel: Normal imaged portions of the stomach. Scattered colonic diverticula. Normal terminal ileum and appendix. Normal small  bowel.  Vascular/Lymphatic: Aortic and branch vessel atherosclerosis. No abdominopelvic adenopathy.  Reproductive: Normal prostate.  Other: No significant free fluid.  Musculoskeletal: Degenerative disc disease at the lumbosacral junction. Transitional S1 vertebral body presumed.  IMPRESSION: 1. Moderate right-sided urinary tract obstruction secondary  to a distal right ureteric stone which measures maximally 13 mm. 2. Right nephrolithiasis. 3. Moderate left renal atrophy. 4. Mild hepatic steatosis.   Electronically Signed  By: Jeronimo Greaves M.D.  On: 05/08/2014 17:17   Assessment Assessed  1. Ureteral calculus, right (N20.1)  74 yo male with 13mm R lower ureteral stone. We have reviewed his films together. Probably uric acid stone. Cannot se on KUB, and hx of uric acid stones. Urine pH 5.0. He is having general maloaie, but no severe flank pain, or Right lower quadrant pain. He wants stone surgery ASAP, because he has been waiting for 2 weeks to be seen. He was set up for cysto, right RPG, right ureteroscopy with laser lithotripsy and basket extraction with backstop. Plan   Unfortunately, due to his profound hyperkalemia, he will need medical reversal of this prior to surgery. He will be admitted to the hospitalist team for management of this and will readdress surgery once his K normalizes. Likely etiology is k-cit along with acute renal failure with Cr 8.5. If his Cr/K do not improve by tomorrow, he may need perc with antegrade stent placement if possible until he can be medically stabilized for a prolonged anesthesia procedure to treat the stone.  Spoke with Dr. Vassie Loll for admission. Appreciate assistance. Urology will follow and attempt to reschedule surgery ASAP.  CBC Latest Ref Rng 05/15/2014 05/15/2014 11/08/2006  WBC 4.0 - 10.5 K/uL - 14.7(H) -  Hemoglobin 13.0 - 17.0 g/dL 16.1 09.6 11.9(L)  Hematocrit 39.0 - 52.0 % 44.0 43.5 35.0(L)  Platelets 150 - 400 K/uL - 262 -    BMP Latest Ref Rng 05/15/2014 05/15/2014 11/08/2006  Glucose 70 - 99 mg/dL 045(W) 098(J) 191(Y)  BUN 6 - 23 mg/dL - <7(W) -  Creatinine 2.95 - 1.35 mg/dL - 6.21(H) -  Sodium 086 - 145 mmol/L 136 136 138  Potassium 3.5 - 5.1 mmol/L 7.1(HH) 7.5(HH) 4.6  Chloride 96 - 112 mmol/L - 108 -  CO2 19 - 32 mmol/L - 16(L) -  Calcium 8.4 - 10.5 mg/dL - 9.2 -      Cr was

## 2014-05-15 NOTE — H&P (Signed)
[facility] History and Physical  Kevin Jenkins ZOX:[id number] DOB: Jun 12, 1940 DOA: 05/15/2014  Referring physician: Dr. Patsi Sears  PCP: Joette Catching, MD   Chief Complaint: hyperkalemia and worsening renal failure  HPI: Kevin Jenkins is a 74 y.o. 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 Patient reports just not feeling right prior to admission and experiencing right side flank pain intermittently. He denies nausea, vomiting, CP, SOB, fever, hematuria, melena, decrease urine output, pruritus or any other complaints. In the peri-operative area he was found with WBC's 14.5, acute on chronic renal failure with Cr of 8.53 and also potassium of 7.5. Patient surgery was cancel and [facility] called to admit patient and assist with his care.   EKG w/o peaked T waves and no acute ischemic changes.     Review of Systems:  Negative except as mentioned on HPI.  Past Medical History  Diagnosis Date  . Hypertension   . Chronic kidney disease     Hx. of kidney stones  . Diabetes mellitus without complication 2006ish   Past Surgical History  Procedure Laterality Date  . Lithotripsy    . Lithotripsy    . Lithotripsy    . Cystoscopy/retrograde/ureteroscopy/stone extraction with basket  2007   Social History:  reports that he quit smoking about 47 years ago. His smoking use included Cigarettes. He has a 1.5 pack-year smoking history. He has never used smokeless tobacco. He reports that he drinks alcohol. He reports that he does not use illicit drugs.  Allergies  Allergen Reactions  . Ivp Dye [Iodinated Diagnostic Agents] Other (See Comments)    fainted    Family Hx: significant for HTN and diabetes; otherwise no contributory according to patient.  Prior to Admission medications   Medication Sig Start Date End Date Taking? Authorizing Provider    aspirin EC 81 MG tablet Take 81 mg by mouth daily.   Yes Historical Provider, MD  glipiZIDE (GLUCOTROL XL) 10 MG 24 hr tablet Take 10 mg by mouth daily with breakfast.   Yes Historical Provider, MD   Physical Exam: Filed Vitals:   05/15/14 1424  BP: 159/88  Pulse: 85  Temp: 98.7 F (37.1 C)  TempSrc: Oral  Resp: 18  Height:  (1.88 m)  Weight: 105.235 kg (232 lb)  SpO2: 100%    Wt Readings from Last 3 Encounters:  05/15/14 105.235 kg (232 lb)    General:  Appears calm and comfortable. Denies CP or SOB. Patient is afebrile and in no distress.  Eyes: PERRL, normal lids, irises & conjunctiva, no icterus, no nystagmus ENT: grossly normal hearing, lips & tongue, no erythema, no exudates and no drainage out of ears or nostrils Neck: no LAD, masses or thyromegaly, no JVD Cardiovascular: RRR, no m/r/g. No LE edema. Telemetry: SR, no arrhythmias on EKG and peri-operative tele monitor Respiratory: CTA bilaterally, no w/r/r. Normal respiratory effort. Abdomen: mildly obese, soft, nt, nd, positive BS; currently W/O CVA Skin: no rash or induration seen on exam Musculoskeletal: grossly normal tone BUE/BLE Psychiatric: grossly normal mood and affect, speech fluent and appropriate Neurologic: grossly non-focal.          Labs on Admission:  Basic Metabolic Panel:  Recent Labs Lab 05/15/14 1505 05/15/14 1641  NA 136 136  K 7.5* 7.1*  CL 108  --   CO2 16*  --   GLUCOSE 152* 128*  BUN <5*  --   CREATININE 8.53*  --   CALCIUM 9.2  --    CBC:  Recent Labs Lab 05/15/14 1505 05/15/14 1641  WBC 14.7*  --   HGB 14.1 15.0  HCT 43.5 44.0  MCV 90.8  --   PLT 262  --    Cardiac Enzymes: No results for input(s): CKTOTAL, CKMB, CKMBINDEX, TROPONINI in the last 168 hours.  BNP (last 3 results) No results for input(s): BNP in the last 8760 hours.  ProBNP (last 3 results) No results for input(s): PROBNP in the last 8760 hours.  CBG:  Recent Labs Lab 05/15/14 1427   GLUCAP 140*    Radiological Exams on Admission: No results found.  EKG:  No acute ischemic changes, no peak T waves or abnormal PR. Rate controlled and sinus  Assessment/Plan 1-Hyperkalemia: patient with acute on chronic renal failure and continue use of potassium citrate and accupril prior to admission. -will admit to telemetry -start sodium bicarb, insulin, calcium gluconate, albuterol and kayexalate -will follow labs and repeat medications as needed -discontinue nephrotoxic agents -EKG w/o hyperkalemic changes -provide supportive care and follow response  2-Acute renal failure superimposed on stage 4 chronic kidney disease: unclear baseline Cr; but stage 4 per GFR -patient w/o recent labs; but per outside records and labs from 2008 (was in the 1.4-3.5 range) -cause for acute failure is multifactorial; including: UTI, use of nephrotoxic agents, ureteral stone blockage; progression from renal failure -will check renal US -provide IVF's -check urine cx -empiric use of rocephin -sodium bicarbonate -urology on board to help with surgical removal of stone vs percutaneous drainage  -will repeat BMET in am  3-Diabetes type 2: with renal failure -will hold oral hypoglycemic agents -start SSI -modified carb diet -check A1C  4-Benign essential HTN: will start bidil BID -stop accupril given renal failure, elevated Cr and hyperkalemia  5-Leukocytosis: with presumed UTI -will check urine cx -start empiric rocephin  6-Right ureteral stone: per urology team -either surgical removal vs percutaneous drainage -renal US ordered to assess for hydronephrosis .  7-tobacco abuse: cessation counseling provided -nicotine patch ordered  Urology: (Dr. Patsi Searsannenbaum)  Code Status: Full DVT Prophylaxis:heparin Family Communication: no family at bedside Disposition Plan: LOS > 2 midnights, inpatient; telemetry bed  Time spent: 50 minutes  Vassie LollMadera, Kendrick Remigio [facility] Pager  [phone]

## 2014-05-15 NOTE — Progress Notes (Signed)
CRITICAL VALUE ALERT  Critical value received:  Potassium 6.2  Date of notification:  05/15/14  Time of notification: 2311  Critical value read back:Yes.    Nurse who received alert:  Linward NatalLindsay Channel Papandrea, RN  MD notified (1st page):  Craige CottaKirby  Time of first page:  2329  MD notified (2nd page):  Time of second page:  Responding MD:  Craige CottaKirby  Time MD responded: 2336  New orders put in the computer

## 2014-05-15 NOTE — Anesthesia Preprocedure Evaluation (Addendum)
Anesthesia Evaluation  Patient identified by MRN, date of birth, ID band Patient awake    Reviewed: Allergy & Precautions, NPO status , Patient's Chart, lab work & pertinent test results, reviewed documented beta blocker date and time   History of Anesthesia Complications Negative for: history of anesthetic complications  Airway Mallampati: I  TM Distance: >3 FB Neck ROM: Full    Dental  (+) Teeth Intact, Dental Advisory Given   Pulmonary former smoker (quit 1968),  breath sounds clear to auscultation        Cardiovascular hypertension, Pt. on medications - anginaRhythm:Regular Rate:Normal     Neuro/Psych    GI/Hepatic negative GI ROS, Neg liver ROS,   Endo/Other  diabetes (glu 140), Oral Hypoglycemic AgentsMorbid obesity  Renal/GU Renal disease (recurrent kidney stones)     Musculoskeletal   Abdominal (+) + obese,   Peds  Hematology negative hematology ROS (+)   Anesthesia Other Findings   Reproductive/Obstetrics                           Anesthesia Physical Anesthesia Plan  ASA: III  Anesthesia Plan: General   Post-op Pain Management:    Induction: Intravenous  Airway Management Planned: LMA  Additional Equipment:   Intra-op Plan:   Post-operative Plan:   Informed Consent: I have reviewed the patients History and Physical, chart, labs and discussed the procedure including the risks, benefits and alternatives for the proposed anesthesia with the patient or authorized representative who has indicated his/her understanding and acceptance.   Dental advisory given  Plan Discussed with: CRNA and Surgeon  Anesthesia Plan Comments: (Plan routine monitors, GA- LMA OK  Repeat K+ 7.1, will discuss with Dr Patsi Searsannenbaum and postpone)       Anesthesia Quick Evaluation

## 2014-05-16 ENCOUNTER — Inpatient Hospital Stay (HOSPITAL_COMMUNITY): Payer: Medicare Other

## 2014-05-16 ENCOUNTER — Encounter (HOSPITAL_COMMUNITY): Payer: Self-pay | Admitting: Radiology

## 2014-05-16 LAB — GLUCOSE, CAPILLARY
GLUCOSE-CAPILLARY: 137 mg/dL — AB (ref 70–99)
Glucose-Capillary: 145 mg/dL — ABNORMAL HIGH (ref 70–99)
Glucose-Capillary: 170 mg/dL — ABNORMAL HIGH (ref 70–99)
Glucose-Capillary: 168 mg/dL — ABNORMAL HIGH (ref 70–99)

## 2014-05-16 LAB — CBC
HEMATOCRIT: 38.6 % — AB (ref 39.0–52.0)
HEMOGLOBIN: 12.7 g/dL — AB (ref 13.0–17.0)
MCH: 29.7 pg (ref 26.0–34.0)
MCHC: 32.9 g/dL (ref 30.0–36.0)
MCV: 90.2 fL (ref 78.0–100.0)
PLATELETS: 229 10*3/uL (ref 150–400)
RBC: 4.28 MIL/uL (ref 4.22–5.81)
RDW: 13.1 % (ref 11.5–15.5)
WBC: 12.1 10*3/uL — AB (ref 4.0–10.5)

## 2014-05-16 LAB — URINALYSIS, ROUTINE W REFLEX MICROSCOPIC
BILIRUBIN URINE: NEGATIVE
Glucose, UA: 100 mg/dL — AB
Hgb urine dipstick: NEGATIVE
Ketones, ur: NEGATIVE mg/dL
LEUKOCYTES UA: NEGATIVE
Nitrite: NEGATIVE
PH: 5 (ref 5.0–8.0)
Protein, ur: 30 mg/dL — AB
SPECIFIC GRAVITY, URINE: 1.016 (ref 1.005–1.030)
UROBILINOGEN UA: 0.2 mg/dL (ref 0.0–1.0)

## 2014-05-16 LAB — BASIC METABOLIC PANEL
ANION GAP: 12 (ref 5–15)
Anion gap: 12 (ref 5–15)
BUN: 121 mg/dL — ABNORMAL HIGH (ref 6–23)
BUN: 118 mg/dL — AB (ref 6–23)
CALCIUM: 9.2 mg/dL (ref 8.4–10.5)
CHLORIDE: 108 mmol/L (ref 96–112)
CHLORIDE: 105 mmol/L (ref 96–112)
CO2: 16 mmol/L — AB (ref 19–32)
CO2: 21 mmol/L (ref 19–32)
CREATININE: 8.53 mg/dL — AB (ref 0.50–1.35)
CREATININE: 8.22 mg/dL — AB (ref 0.50–1.35)
Calcium: 8.9 mg/dL (ref 8.4–10.5)
GFR calc non Af Amer: 6 mL/min — ABNORMAL LOW (ref >90)
GFR, EST AFRICAN AMERICAN: 6 mL/min — AB (ref >90)
GFR, EST AFRICAN AMERICAN: 7 mL/min — AB (ref >90)
GFR, EST NON AFRICAN AMERICAN: 5 mL/min — AB (ref >90)
Glucose, Bld: 152 mg/dL — ABNORMAL HIGH (ref 70–99)
Glucose, Bld: 143 mg/dL — ABNORMAL HIGH (ref 70–99)
Potassium: 7.5 mmol/L (ref 3.5–5.1)
Potassium: 5.9 mmol/L — ABNORMAL HIGH (ref 3.5–5.1)
SODIUM: 136 mmol/L (ref 135–145)
Sodium: 138 mmol/L (ref 135–145)

## 2014-05-16 LAB — URINE MICROSCOPIC-ADD ON

## 2014-05-16 LAB — POTASSIUM
POTASSIUM: 5.2 mmol/L — AB (ref 3.5–5.1)
Potassium: 6.1 mmol/L (ref 3.5–5.1)

## 2014-05-16 MED ORDER — MIDAZOLAM HCL 2 MG/2ML IJ SOLN
INTRAMUSCULAR | Status: AC
  Filled 2014-05-16: qty 6

## 2014-05-16 MED ORDER — CIPROFLOXACIN IN D5W 400 MG/200ML IV SOLN
400.0000 mg | INTRAVENOUS | Status: AC
  Administered 2014-05-16: 400 mg via INTRAVENOUS
  Filled 2014-05-16: qty 200

## 2014-05-16 MED ORDER — MIDAZOLAM HCL 2 MG/2ML IJ SOLN
INTRAMUSCULAR | Status: AC | PRN
  Administered 2014-05-16: 1 mg via INTRAVENOUS

## 2014-05-16 MED ORDER — FENTANYL CITRATE 0.05 MG/ML IJ SOLN
INTRAMUSCULAR | Status: AC
  Filled 2014-05-16: qty 4

## 2014-05-16 MED ORDER — FENTANYL CITRATE 0.05 MG/ML IJ SOLN
INTRAMUSCULAR | Status: AC | PRN
  Administered 2014-05-16: 50 ug via INTRAVENOUS

## 2014-05-16 MED ORDER — LIDOCAINE HCL 1 % IJ SOLN
INTRAMUSCULAR | Status: AC
  Filled 2014-05-16: qty 20

## 2014-05-16 MED ORDER — SODIUM POLYSTYRENE SULFONATE 15 GM/60ML PO SUSP
30.0000 g | Freq: Once | ORAL | Status: AC
  Administered 2014-05-16: 30 g via ORAL
  Filled 2014-05-16: qty 120

## 2014-05-16 MED ORDER — SODIUM CHLORIDE 0.9 % IV SOLN
INTRAVENOUS | Status: DC
  Administered 2014-05-16 – 2014-05-19 (×9): via INTRAVENOUS

## 2014-05-16 MED ORDER — CIPROFLOXACIN IN D5W 400 MG/200ML IV SOLN
INTRAVENOUS | Status: AC
  Filled 2014-05-16: qty 200

## 2014-05-16 NOTE — Progress Notes (Signed)
Referring Physician(s): Dr. Harlene Saltsavid Johnson  Subjective: 74 yo male with known renal stone issues. He was in the process of being scheduled for surgery but has now been admitted with ARI and hyperkalemia secondary to obstructive uropathy. IR is requested to place (R)nephrostomy tube to decompress right hydronephrosis and allow pt renal function and K to improve so that he can be taken to OR safely. Chart, PMHx, meds, labs, imaging reviewed. Has been NPO this am, has not received any anticoagulation  Allergies: Ivp dye  Medications: Prior to Admission medications   Medication Sig Start Date End Date Taking? Authorizing Provider  aspirin EC 81 MG tablet Take 81 mg by mouth daily.   Yes Historical Provider, MD  glipiZIDE (GLUCOTROL XL) 10 MG 24 hr tablet Take 10 mg by mouth daily with breakfast.   Yes Historical Provider, MD    Review of Systems  Vital Signs: BP 121/82 mmHg  Pulse 70  Temp(Src) 97.6 F (36.4 C) (Oral)  Resp 18  Ht 6\' 2"  (1.88 m)  Wt 232 lb (105.235 kg)  BMI 29.77 kg/m2  SpO2 100%  Physical Exam  Constitutional: He is oriented to person, place, and time. He appears well-developed and well-nourished. No distress.  Neck: Normal range of motion. No JVD present. No tracheal deviation present.  Cardiovascular: Normal rate, regular rhythm and normal heart sounds.   Pulmonary/Chest: Effort normal and breath sounds normal. No respiratory distress.  Abdominal: Soft. Bowel sounds are normal. There is no tenderness.  Neurological: He is alert and oriented to person, place, and time.  Skin: Skin is warm and dry.  Psychiatric: He has a normal mood and affect. Judgment normal.    Imaging: [place]  05/15/2014   CLINICAL DATA:  74 year old male with acute renal failure. Elevated white blood cell count. Known history of renal calculi.  EXAM: RENAL/URINARY TRACT ULTRASOUND COMPLETE  COMPARISON:  No priors.  FINDINGS: Right Kidney:  Length: 15.5 cm. Echogenicity within  normal limits. Moderate hydronephrosis. Notably, the visualized portions of the right ureter are also moderately dilated. Echogenic foci with posterior acoustic shadowing in the lower pole collecting system, compatible with stones, largest of which measures 2 cm. Exophytic 8.1 x 4.8 x 6.7 cm anechoic lesion with increased through transmission in the lower pole compatible with a large cyst.  Left Kidney:  Length: 11.5 cm. Diffuse cortical thinning and increased echogenicity. No mass or hydronephrosis visualized.  Bladder:  Appears normal for degree of bladder distention. Left ureteral jet visualize. No right ureteral jet visualized.  IMPRESSION: 1. Moderate right-sided hydroureteronephrosis concerning for obstructive stone in the distal right ureter. Correlation with noncontrast CT scan could provide additional diagnostic information if clinically appropriate. 2. Other large stones are present within the right renal collecting system, measuring up to 2 cm in the lower pole. 3. Left kidney is remarkable for diffuse cortical thinning and increased echogenicity, suggesting underlying medical renal disease. 4. Large exophytic simple cyst in the lower pole of the right kidney incidentally noted.   Electronically Signed   By: Trudie Reedaniel  Entrikin M.D.   On: 05/15/2014 21:33    Labs:  CBC:  Recent Labs  05/15/14 1505 05/15/14 1641 05/15/14 2156 05/16/14 0830  WBC 14.7*  --  15.2* 12.1*  HGB 14.1 15.0 13.9 12.7*  HCT 43.5 44.0 43.3 38.6*  PLT 262  --  271 229    COAGS:  Recent Labs  05/15/14 2156  INR 1.13  APTT 38*    BMP:  Recent Labs  05/15/14 1505 05/15/14 1641 05/15/14 2156 05/15/14 2300 05/16/14 0320 05/16/14 0830  NA 136 136 142  --   --  138  K 7.5* 7.1* 6.2* 6.1* 5.2* 5.9*  CL 108  --  109  --   --  105  CO2 16*  --  20  --   --  21  GLUCOSE 152* 128* 152*  --   --  143*  BUN 121*  --  118*  --   --  PENDING  CALCIUM 9.2  --  9.7  --   --  8.9  CREATININE 8.53*  --  8.24*  --    --  8.22*  GFRNONAA 5*  --  6*  --   --  6*  GFRAA 6*  --  7*  --   --  7*    LIVER FUNCTION TESTS:  Recent Labs  05/15/14 2156  BILITOT 0.4  AST 14  ALT 15  ALKPHOS 61  PROT 7.6  ALBUMIN 4.0    Assessment and Plan: Obstructive uropathy with ARI and right hydronephrosis secondary to renal and ureteral calculi Plan for (R)PCN placement today, with eventually operative management. Labs reviewed Risks and Benefits discussed with the patient including, but not limited to infection, bleeding, significant bleeding causing loss or decrease in renal function or damage to adjacent structures.  All of the patient's questions were answered, patient is agreeable to proceed. Consent signed and in chart.    I spent a total of 20 minutes face to face in clinical consultation/evaluation, greater than 50% of which was counseling/coordinating care for placement of right nephrostomy tube.  SignedBrayton El 05/16/2014, 9:23 AM

## 2014-05-16 NOTE — Progress Notes (Signed)
[facility] PROGRESS NOTE  Kevin Jenkins ZOX:[id number] DOB: [birth year] DOA: 05/15/2014 PCP: No primary care provider on file.  Assessment/Plan:  Primary problem:   Hyperkalemia secondary to ACE inhibitor, ARF, potassium citrate as outpt: improved but still 5.9. Will give more kayexalate, insulin    Acute renal failure, multifactorial: ACE inhibitor, prerenal, right hydronephrosis, ?CKD?  No recent creatinines available. 1.27 in 2014. Continue hydration.  DM2? Wife says "prediabetes", but glucotrol listed on home med list. hgb a1c pending. CBGs not bad. SSI for now.    Benign essential HTN: controlled off ace    Leukocytosis: no UA done. On empiric rocephin. Will get UA and culture asap    Right ureteral stone with obstruction/right hydro: for perc nephrostomy today  Metabolic acidosis   Code Status:  full Family Communication:  Wife at bedside Disposition Plan:    Consultants:  Urology  IR  Procedures:     Antibiotics:  Ceftriaxone 3/29  HPI/Subjective: Slight right mid abd pain. No dysuria. No F/C. No N/V. Appetite has been poor for weeks  Objective: Filed Vitals:   05/16/14 0533  BP: 121/82  Pulse: 70  Temp: 97.6 F (36.4 C)  Resp: 18    Intake/Output Summary (Last 24 hours) at 05/16/14 0912 Last data filed at 05/15/14 2300  Gross per 24 hour  Intake 413.75 ml  Output      0 ml  Net 413.75 ml   Filed Weights   05/15/14 1424  Weight: 105.235 kg (232 lb)    Exam:   General:  Nontoxic. A and o. Comfortable  HEENT: no thrush. Slightly dry MM  Cardiovascular: RRR without MGR  Respiratory: CTA without WRR  Abdomen: S, NT, ND, BS present  Ext: no CCE  Skin: poor turgor  Basic Metabolic Panel:  Recent Labs Lab 05/15/14 1505 05/15/14 1641 05/15/14 2156 05/15/14 2300 05/16/14 0320  NA 136 136 142  --   --   K 7.5* 7.1* 6.2* 6.1* 5.2*  CL 108  --  109  --   --   CO2 16*  --  20  --   --   GLUCOSE 152* 128* 152*  --   --    BUN 121*  --  118*  --   --   CREATININE 8.53*  --  8.24*  --   --   CALCIUM 9.2  --  9.7  --   --   MG  --   --  2.6*  --   --   PHOS  --   --  8.9*  --   --    Liver Function Tests:  Recent Labs Lab 05/15/14 2156  AST 14  ALT 15  ALKPHOS 61  BILITOT 0.4  PROT 7.6  ALBUMIN 4.0   No results for input(s): LIPASE, AMYLASE in the last 168 hours. No results for input(s): AMMONIA in the last 168 hours. CBC:  Recent Labs Lab 05/15/14 1505 05/15/14 1641 05/15/14 2156 05/16/14 0830  WBC 14.7*  --  15.2* 12.1*  HGB 14.1 15.0 13.9 12.7*  HCT 43.5 44.0 43.3 38.6*  MCV 90.8  --  91.2 90.2  PLT 262  --  271 229   Cardiac Enzymes: No results for input(s): CKTOTAL, CKMB, CKMBINDEX, TROPONINI in the last 168 hours. BNP (last 3 results) No results for input(s): BNP in the last 8760 hours.  ProBNP (last 3 results) No results for input(s): PROBNP in the last 8760 hours.  CBG:  Recent Labs Lab 05/15/14  1427 05/15/14 2137 05/16/14 0742  GLUCAP 140* 126* 145*    No results found for this or any previous visit (from the past 240 hour(s)).   Studies: [place] [facility]  05/15/2014   CLINICAL DATA:  74 year old male with acute renal failure. Elevated white blood cell count. Known history of renal calculi.  EXAM: RENAL/URINARY TRACT ULTRASOUND COMPLETE  COMPARISON:  No priors.  FINDINGS: Right Kidney:  Length: 15.5 cm. Echogenicity within normal limits. Moderate hydronephrosis. Notably, the visualized portions of the right ureter are also moderately dilated. Echogenic foci with posterior acoustic shadowing in the lower pole collecting system, compatible with stones, largest of which measures 2 cm. Exophytic 8.1 x 4.8 x 6.7 cm anechoic lesion with increased through transmission in the lower pole compatible with a large cyst.  Left Kidney:  Length: 11.5 cm. Diffuse cortical thinning and increased echogenicity. No mass or hydronephrosis visualized.  Bladder:  Appears normal for degree of bladder  distention. Left ureteral jet visualize. No right ureteral jet visualized.  IMPRESSION: 1. Moderate right-sided hydroureteronephrosis concerning for obstructive stone in the distal right ureter. Correlation with noncontrast CT scan could provide additional diagnostic information if clinically appropriate. 2. Other large stones are present within the right renal collecting system, measuring up to 2 cm in the lower pole. 3. Left kidney is remarkable for diffuse cortical thinning and increased echogenicity, suggesting underlying medical renal disease. 4. Large exophytic simple cyst in the lower pole of the right kidney incidentally noted.   Electronically Signed   By: Kevin Reedaniel  Entrikin M.D.   On: 05/15/2014 21:33    Scheduled Meds: . albuterol  10 mg Nebulization Once  . aspirin EC  81 mg Oral Daily  . cefTRIAXone (ROCEPHIN)  IV  1 g Intravenous Q24H  . insulin aspart  0-15 Units Subcutaneous TID WC  . isosorbide-hydrALAZINE  1 tablet Oral BID  . polyethylene glycol  17 g Oral Daily  . sodium chloride  3 mL Intravenous Q12H   Continuous Infusions:   Time spent: 25 minutes  Kevin Jenkins L  [facility] [email or web address], password [email or web address] 05/16/2014, 9:12 AM  LOS: 1 day

## 2014-05-16 NOTE — Progress Notes (Signed)
Subjective: Admitted overnight for treatment of hyperkalemia & severe ARF secondary to poorly functioning left kidney (based on CT scan, no functional imaging since 2008) and obstructed right kidney due to 1.3 cm distal ureteral stone. Plan for R USE aborted due to hyperkalemia. No issues overnight, hyperK treated, however Cr did not change. No change in flank pain, no fevers/chills/nausea/vomiting. NPO this am.   Objective: Vital signs in last 24 hours: Temp:  [97.6 F (36.4 C)-98.7 F (37.1 C)] 97.6 F (36.4 C) (03/30 0533) Pulse Rate:  [70-101] 70 (03/30 0533) Resp:  [16-18] 18 (03/30 0533) BP: (119-192)/(81-103) 121/82 mmHg (03/30 0533) SpO2:  [95 %-100 %] 100 % (03/30 0533) Weight:  [105.235 kg (232 lb)] 105.235 kg (232 lb) (03/29 1424)  Intake/Output from previous day: 03/29 0701 - 03/30 0700 In: 413.8 [P.O.:240; I.V.:123.8; IV Piggyback:50] Out: -   Physical Exam:  General: Alert and oriented CV: RRR Lungs: Clear Abdomen: Soft, ND Ext: NT, No erythema  Lab Results:  Recent Labs  05/15/14 1641 05/15/14 2156 05/16/14 0830  HGB 15.0 13.9 12.7*  HCT 44.0 43.3 38.6*   BMET  Recent Labs  05/15/14 2156  05/16/14 0320 05/16/14 0830  NA 142  --   --  138  K 6.2*  < > 5.2* 5.9*  CL 109  --   --  105  CO2 20  --   --  21  GLUCOSE 152*  --   --  143*  BUN 118*  --   --  118*  CREATININE 8.24*  --   --  8.22*  CALCIUM 9.7  --   --  8.9  < > = values in this interval not displayed.   Studies/Results: [place] [facility]  05/15/2014   CLINICAL DATA:  74 year old male with acute renal failure. Elevated white blood cell count. Known history of renal calculi.  EXAM: RENAL/URINARY TRACT ULTRASOUND COMPLETE  COMPARISON:  No priors.  FINDINGS: Right Kidney:  Length: 15.5 cm. Echogenicity within normal limits. Moderate hydronephrosis. Notably, the visualized portions of the right ureter are also moderately dilated. Echogenic foci with posterior acoustic shadowing in the lower pole  collecting system, compatible with stones, largest of which measures 2 cm. Exophytic 8.1 x 4.8 x 6.7 cm anechoic lesion with increased through transmission in the lower pole compatible with a large cyst.  Left Kidney:  Length: 11.5 cm. Diffuse cortical thinning and increased echogenicity. No mass or hydronephrosis visualized.  Bladder:  Appears normal for degree of bladder distention. Left ureteral jet visualize. No right ureteral jet visualized.  IMPRESSION: 1. Moderate right-sided hydroureteronephrosis concerning for obstructive stone in the distal right ureter. Correlation with noncontrast CT scan could provide additional diagnostic information if clinically appropriate. 2. Other large stones are present within the right renal collecting system, measuring up to 2 cm in the lower pole. 3. Left kidney is remarkable for diffuse cortical thinning and increased echogenicity, suggesting underlying medical renal disease. 4. Large exophytic simple cyst in the lower pole of the right kidney incidentally noted.   Electronically Signed   By: Trudie Reedaniel  Entrikin M.D.   On: 05/15/2014 21:33    Assessment/Plan: 78M with 1.3 cm obstructing distal right ureteral stone and likely a poorly functioning left kidney resulting in hyperkalemia (also secondary to K-cit for stone prevention of uric acid stones) and ARF (Cr 8.2 from baseline of 1.2 in 10/2012).   -- Right PCN today, hopefully with separate indwelling ureteral stent if possible to facilitate safer staged USE of large, likely  impacted distal ureteral stone and renal stones in the future -- UCx pending -- INR WNL, NPO as of this am. -- Will continue to follow along. Appreciate Hospitalist management of medical issues    LOS: 1 day   Sukhmani Fetherolf C 05/16/2014, 11:52 AM

## 2014-05-16 NOTE — Procedures (Signed)
Successful RT PCN  NO COMP STABLE FULL REPORT IN PACS

## 2014-05-16 NOTE — Progress Notes (Signed)
Nutrition Brief Note  Patient identified on the Malnutrition Screening Tool (MST) Report  Wt Readings from Last 15 Encounters:  05/15/14 232 lb (105.235 kg)    Body mass index is 29.77 kg/(m^2). Patient meets criteria for overweight based on current BMI.   Current diet order is NPO. Labs and medications reviewed.   Per Pt and his wife, pt had good appetite prior to admission and lost 8 Lbs in recent 2 - 3 weeks, however, it was intentional.   No nutrition interventions warranted at this time. If nutrition issues arise, please consult RD.   Lennard Capek A. [employer] Dietetic Intern Pager: [phone] 05/16/2014 11:48 AM

## 2014-05-17 LAB — SURGICAL PCR SCREEN
MRSA, PCR: NEGATIVE
Staphylococcus aureus: NEGATIVE

## 2014-05-17 LAB — URINE CULTURE
CULTURE: NO GROWTH
Colony Count: NO GROWTH
Colony Count: NO GROWTH
Culture: NO GROWTH

## 2014-05-17 LAB — BASIC METABOLIC PANEL
Anion gap: 10 (ref 5–15)
BUN: 81 mg/dL — AB (ref 6–23)
CALCIUM: 8.9 mg/dL (ref 8.4–10.5)
CO2: 22 mmol/L (ref 19–32)
CREATININE: 3.84 mg/dL — AB (ref 0.50–1.35)
Chloride: 108 mmol/L (ref 96–112)
GFR, EST AFRICAN AMERICAN: 17 mL/min — AB (ref >90)
GFR, EST NON AFRICAN AMERICAN: 14 mL/min — AB (ref >90)
Glucose, Bld: 122 mg/dL — ABNORMAL HIGH (ref 70–99)
Potassium: 5.2 mmol/L — ABNORMAL HIGH (ref 3.5–5.1)
Sodium: 140 mmol/L (ref 135–145)

## 2014-05-17 LAB — GLUCOSE, CAPILLARY
GLUCOSE-CAPILLARY: 124 mg/dL — AB (ref 70–99)
GLUCOSE-CAPILLARY: 154 mg/dL — AB (ref 70–99)
GLUCOSE-CAPILLARY: 118 mg/dL — AB (ref 70–99)
Glucose-Capillary: 141 mg/dL — ABNORMAL HIGH (ref 70–99)

## 2014-05-17 LAB — HEMOGLOBIN A1C
HEMOGLOBIN A1C: 8.3 % — AB (ref 4.8–5.6)
Mean Plasma Glucose: 192 mg/dL

## 2014-05-17 MED ORDER — DIPHENHYDRAMINE HCL 50 MG PO CAPS
50.0000 mg | ORAL_CAPSULE | Freq: Once | ORAL | Status: AC
Start: 2014-05-18 — End: 2014-05-18
  Administered 2014-05-18: 50 mg via ORAL
  Filled 2014-05-17: qty 1

## 2014-05-17 MED ORDER — AMLODIPINE BESYLATE 5 MG PO TABS
5.0000 mg | ORAL_TABLET | Freq: Every day | ORAL | Status: DC
  Administered 2014-05-17 – 2014-05-20 (×4): 5 mg via ORAL
  Filled 2014-05-17 (×4): qty 1

## 2014-05-17 MED ORDER — PREDNISONE 50 MG PO TABS
50.0000 mg | ORAL_TABLET | Freq: Four times a day (QID) | ORAL | Status: AC
  Administered 2014-05-17 – 2014-05-18 (×3): 50 mg via ORAL
  Filled 2014-05-17 (×3): qty 1

## 2014-05-17 NOTE — Progress Notes (Signed)
[facility] PROGRESS NOTE  Kevin Jenkins UEA:[id number] DOB: 1941-01-08 DOA: 05/15/2014 PCP: No primary care provider on file.  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  -In the peri-operative area he was found with WBC's 14.5, acute on chronic renal failure with Cr of 8.53 and also potassium of 7.5. Patient surgery was cancel and [facility] called to admit patient and assist with his care.   1. Hyperkalemia due to ACE inhibitor in the setting of AKI,  -potassium improved after kayexalate, insulin, IVF  2. Acute renal failuremultifactorial: ACE inhibitor, prerenal, right hydronephrosis -renal function improving on IVF, s/p R nephrostomy  3. DM a1c-8.3; cont ISS while inpatient  4. Benign essential HTN: controlled off ace 5. Leukocytosis ? Stress related; UA: unremarkable; afebrile; leukocytosis is improving;   -urine cultures: negative; will d/c empiric rocephin.  6. Right ureteral stone with obstruction/right hydro: s/p perc nephrostomy -per urology: cysto, right ureteroscopy and laser of stone and stent on Saturday    Code Status: full Family Communication: d/w patient (indicate person spoken with, relationship, and if by phone, the number) Disposition Plan: home pend surgery    Consultants:  Urology, IR  Procedures:  IR nephr tube   Antibiotics: Ceftriaxone 3/29-3/31  HPI/Subjective: Alert, riented   Objective: Filed Vitals:   05/17/14 0632  BP: 157/95  Pulse: 73  Temp: 98 F (36.7 C)  Resp: 16    Intake/Output Summary (Last 24 hours) at 05/17/14 1431 Last data filed at 05/17/14 1144  Gross per 24 hour  Intake   1665 ml  Output   5275 ml  Net  -3610 ml   Filed Weights   05/15/14 1424  Weight: 105.235 kg (232 lb)    Exam:   General:  Alert, no distress   Cardiovascular: s1,s2  rrr  Respiratory: CTA BL  Abdomen: soft, nt,nd   Musculoskeletal: no Leg edema   Data Reviewed: Basic Metabolic Panel:  Recent Labs Lab 05/15/14 1505 05/15/14 1641 05/15/14 2156 05/15/14 2300 05/16/14 0320 05/16/14 0830 05/17/14 0418  NA 136 136 142  --   --  138 140  K 7.5* 7.1* 6.2* 6.1* 5.2* 5.9* 5.2*  CL 108  --  109  --   --  105 108  CO2 16*  --  20  --   --  21 22  GLUCOSE 152* 128* 152*  --   --  143* 122*  BUN 121*  --  118*  --   --  118* 81*  CREATININE 8.53*  --  8.24*  --   --  8.22* 3.84*  CALCIUM 9.2  --  9.7  --   --  8.9 8.9  MG  --   --  2.6*  --   --   --   --   PHOS  --   --  8.9*  --   --   --   --    Liver Function Tests:  Recent Labs Lab 05/15/14 2156  AST 14  ALT 15  ALKPHOS 61  BILITOT 0.4  PROT 7.6  ALBUMIN 4.0   No results for input(s): LIPASE, AMYLASE in the last 168 hours. No results for input(s): AMMONIA in the last 168 hours. CBC:  Recent Labs Lab 05/15/14 1505 05/15/14 1641 05/15/14 2156 05/16/14 0830  WBC 14.7*  --  15.2* 12.1*  HGB 14.1 15.0  13.9 12.7*  HCT 43.5 44.0 43.3 38.6*  MCV 90.8  --  91.2 90.2  PLT 262  --  271 229   Cardiac Enzymes: No results for input(s): CKTOTAL, CKMB, CKMBINDEX, TROPONINI in the last 168 hours. BNP (last 3 results) No results for input(s): BNP in the last 8760 hours.  ProBNP (last 3 results) No results for input(s): PROBNP in the last 8760 hours.  CBG:  Recent Labs Lab 05/16/14 1148 05/16/14 1724 05/16/14 2158 05/17/14 0732 05/17/14 1152  GLUCAP 137* 170* 168* 124* 154*    Recent Results (from the past 240 hour(s))  Urine culture     Status: None   Collection Time: 05/15/14 10:32 PM  Result Value Ref Range Status   Specimen Description URINE, CLEAN CATCH  Final   Special Requests NONE  Final   Colony Count NO GROWTH Performed at [facility]   Final   Culture NO GROWTH Performed at [facility]   Final   Report Status 05/17/2014 FINAL  Final   Culture, Urine     Status: None   Collection Time: 05/16/14 12:43 PM  Result Value Ref Range Status   Specimen Description URINE, CLEAN CATCH  Final   Special Requests NONE  Final   Colony Count NO GROWTH Performed at [facility]   Final   Culture NO GROWTH Performed at [facility]   Final   Report Status 05/17/2014 FINAL  Final     Studies: US Renal  05/15/2014   CLINICAL DATA:  74 year old male with acute renal failure. Elevated white blood cell count. Known history of renal calculi.  EXAM: RENAL/URINARY TRACT ULTRASOUND COMPLETE  COMPARISON:  No priors.  FINDINGS: Right Kidney:  Length: 15.5 cm. Echogenicity within normal limits. Moderate hydronephrosis. Notably, the visualized portions of the right ureter are also moderately dilated. Echogenic foci with posterior acoustic shadowing in the lower pole collecting system, compatible with stones, largest of which measures 2 cm. Exophytic 8.1 x 4.8 x 6.7 cm anechoic lesion with increased through transmission in the lower pole compatible with a large cyst.  Left Kidney:  Length: 11.5 cm. Diffuse cortical thinning and increased echogenicity. No mass or hydronephrosis visualized.  Bladder:  Appears normal for degree of bladder distention. Left ureteral jet visualize. No right ureteral jet visualized.  IMPRESSION: 1. Moderate right-sided hydroureteronephrosis concerning for obstructive stone in the distal right ureter. Correlation with noncontrast CT scan could provide additional diagnostic information if clinically appropriate. 2. Other large stones are present within the right renal collecting system, measuring up to 2 cm in the lower pole. 3. Left kidney is remarkable for diffuse cortical thinning and increased echogenicity, suggesting underlying medical renal disease. 4. Large exophytic simple cyst in the lower pole of the right kidney incidentally noted.   Electronically Signed   By: Trudie Reed M.D.   On: 05/15/2014 21:33    Ir Nephrostomy Placement Right  05/16/2014   CLINICAL DATA:  Acute on chronic renal failure, right hydronephrosis  EXAM: 1. ULTRASOUND GUIDANCE FOR PUNCTURE OF THE RIGHT RENAL COLLECTING SYSTEM. 2. RIGHT PERCUTANEOUS NEPHROSTOMY TUBE PLACEMENT.  COMPARISON:  05/15/2014  ANESTHESIA/SEDATION: 1.0 mg IV Versed; 50 mcg IV Fentanyl.  Total Moderate Sedation Time  Fifteen minutes  CONTRAST:  None.  MEDICATIONS: 400 mg Cipro administered within 1 hour of the procedure. Antibiotic was administered in an appropriate time frame prior to skin puncture.  FLUOROSCOPY TIME:  1 minutes 12 seconds  PROCEDURE: The procedure, risks, benefits, and alternatives  were explained to the patient. Questions regarding the procedure were encouraged and answered. The patient understands and consents to the procedure.  The right flank region was prepped with Betadine in a sterile fashion, and a sterile drape was applied covering the operative field. A sterile gown and sterile gloves were used for the procedure. Local anesthesia was provided with 1% Lidocaine.  Ultrasound was used to localize the right kidney. Under direct ultrasound guidance, a 21 gauge needle was advanced into the renal collecting system. Ultrasound image documentation was performed. Aspiration of urine sample was performed followed by contrast injection.  A transitional dilator was advanced over a guidewire. Percutaneous tract dilatation was then performed over the guidewire. A 10 -French percutaneous nephrostomy tube was then advanced and formed in the collecting system. Catheter position was confirmed by fluoroscopy.  The catheter was secured at the skin with a Prolene retention suture and Stat-Lock device. A gravity bag was placed.  COMPLICATIONS: None.  FINDINGS: Imaging confirms placement of a 10 French right nephrostomy with ultrasound and fluoroscopic guidance  IMPRESSION: Successful right 10 French nephrostomy insertion   Electronically Signed   By: Judie PetitM.  Shick M.D.    On: 05/16/2014 15:09    Scheduled Meds: . albuterol  10 mg Nebulization Once  . aspirin EC  81 mg Oral Daily  . cefTRIAXone (ROCEPHIN)  IV  1 g Intravenous Q24H  . [START ON 05/18/2014] diphenhydrAMINE  50 mg Oral Once  . insulin aspart  0-15 Units Subcutaneous TID WC  . polyethylene glycol  17 g Oral Daily  . predniSONE  50 mg Oral Q6H  . sodium chloride  3 mL Intravenous Q12H   Continuous Infusions: . sodium chloride 100 mL/hr at 05/16/14 2039    Active Problems:   Hyperkalemia   Acute renal failure superimposed on stage 4 chronic kidney disease   Diabetes   Benign essential HTN   Leukocytosis   Right ureteral stone   Metabolic acidosis    Time spent: >35 minutes     Esperanza SheetsBURIEV, Curtis Uriarte N  [facility] Pager [phone]. If 7PM-7AM, please contact night-coverage at [email or web address], password [email or web address] 05/17/2014, 2:31 PM  LOS: 2 days

## 2014-05-17 NOTE — Progress Notes (Addendum)
Urology Progress Note : Acute on CKD, hyperkalemia, DM, Right ureteral stone, poorly functioning L renal unit.  Post Right perc nephrostomy yesterday Pt feels much better this AM.    Subjective:     No acute urologic events overnight. Ambulation:   positive Flatus:    positive Bowel movement  positive  Pain: some relief  Objective:  Blood pressure 157/95, pulse 73, temperature 98 F (36.7 C), temperature source Oral, resp. rate 16, height 6\' 2"  (1.88 m), weight 105.235 kg (232 lb), SpO2 97 %.  Physical Exam:  General:  No acute distress, awake Extremities: extremities normal, atraumatic, no cyanosis or edema Genitourinary:  Normal s-p exam Foley:  none    I/O last 3 completed shifts: In: 1838.8 [P.O.:480; I.V.:1253.8; Other:5; IV Piggyback:100] Out: 4100 [Urine:4100]  Recent Labs     05/15/14  2156  05/16/14  0830  HGB  13.9  12.7*  WBC  15.2*  12.1*  PLT  271  229    Recent Labs     05/16/14  0830  05/17/14  0418  NA  138  140  K  5.9*  5.2*  CL  105  108  CO2  21  22  BUN  118*  81*  CREATININE  8.22*  3.84*  CALCIUM  8.9  8.9  GFRNONAA  6*  14*  GFRAA  7*  17*     Recent Labs     05/15/14  2156  INR  1.13  APTT  38*     Invalid input(s): ABG  Assessment/Plan: Note improvement in Cr from 8.2 to 3.84 and K from 5.9 to 5.2 this AM ( was 7.5 on admission)  Continue any current medications.   If medically stable, hope for surgery for cysto, right ureteroscopy and laser of stone and Jj stent on Sat. AM.   Will request antegrade double J stent placement tomorrow in IR to facilitate ureteroscopic stone extraction.

## 2014-05-17 NOTE — Progress Notes (Signed)
Patient ID: Kevin Jenkins, male   DOB: November 05, 1940, 74 y.o.   MRN: [id number]    Referring Physician(s): Johnson,D  Subjective:  Patient feeling much better since right nephrostomy performed; has some mild right flank discomfort but denies nausea/vomiting  Allergies: Ivp dye  Medications: Prior to Admission medications   Medication Sig Start Date End Date Taking? Authorizing Provider  aspirin EC 81 MG tablet Take 81 mg by mouth daily.   Yes Historical Provider, MD  glipiZIDE (GLUCOTROL XL) 10 MG 24 hr tablet Take 10 mg by mouth daily with breakfast.   Yes Historical Provider, MD     Vital Signs: BP 157/95 mmHg  Pulse 73  Temp(Src) 98 F (36.7 C) (Oral)  Resp 16  Ht  (1.88 m)  Wt 232 lb (105.235 kg)  BMI 29.77 kg/m2  SpO2 97%  Physical Exam patient awake, alert; chest clear to auscultation bilaterally, heart with regular rate and rhythm. Abdomen soft, positive bowel sounds, mild right flank soreness, right PCN intact, draining tea-colored urine, output 350 mL today, 4 L yesterday  Imaging: US Renal  05/15/2014   CLINICAL DATA:  74 year old male with acute renal failure. Elevated white blood cell count. Known history of renal calculi.  EXAM: RENAL/URINARY TRACT ULTRASOUND COMPLETE  COMPARISON:  No priors.  FINDINGS: Right Kidney:  Length: 15.5 cm. Echogenicity within normal limits. Moderate hydronephrosis. Notably, the visualized portions of the right ureter are also moderately dilated. Echogenic foci with posterior acoustic shadowing in the lower pole collecting system, compatible with stones, largest of which measures 2 cm. Exophytic 8.1 x 4.8 x 6.7 cm anechoic lesion with increased through transmission in the lower pole compatible with a large cyst.  Left Kidney:  Length: 11.5 cm. Diffuse cortical thinning and increased echogenicity. No mass or hydronephrosis visualized.  Bladder:  Appears normal for degree of bladder distention. Left ureteral jet visualize. No right  ureteral jet visualized.  IMPRESSION: 1. Moderate right-sided hydroureteronephrosis concerning for obstructive stone in the distal right ureter. Correlation with noncontrast CT scan could provide additional diagnostic information if clinically appropriate. 2. Other large stones are present within the right renal collecting system, measuring up to 2 cm in the lower pole. 3. Left kidney is remarkable for diffuse cortical thinning and increased echogenicity, suggesting underlying medical renal disease. 4. Large exophytic simple cyst in the lower pole of the right kidney incidentally noted.   Electronically Signed   By: Trudie Reed M.D.   On: 05/15/2014 21:33   Ir Nephrostomy Placement Right  05/16/2014   CLINICAL DATA:  Acute on chronic renal failure, right hydronephrosis  EXAM: 1. ULTRASOUND GUIDANCE FOR PUNCTURE OF THE RIGHT RENAL COLLECTING SYSTEM. 2. RIGHT PERCUTANEOUS NEPHROSTOMY TUBE PLACEMENT.  COMPARISON:  05/15/2014  ANESTHESIA/SEDATION: 1.0 mg IV Versed; 50 mcg IV Fentanyl.  Total Moderate Sedation Time  Fifteen minutes  CONTRAST:  None.  MEDICATIONS: 400 mg Cipro administered within 1 hour of the procedure. Antibiotic was administered in an appropriate time frame prior to skin puncture.  FLUOROSCOPY TIME:  1 minutes 12 seconds  PROCEDURE: The procedure, risks, benefits, and alternatives were explained to the patient. Questions regarding the procedure were encouraged and answered. The patient understands and consents to the procedure.  The right flank region was prepped with Betadine in a sterile fashion, and a sterile drape was applied covering the operative field. A sterile gown and sterile gloves were used for the procedure. Local anesthesia was provided with 1% Lidocaine.  Ultrasound was used to localize the  right kidney. Under direct ultrasound guidance, a 21 gauge needle was advanced into the renal collecting system. Ultrasound image documentation was performed. Aspiration of urine sample was  performed followed by contrast injection.  A transitional dilator was advanced over a guidewire. Percutaneous tract dilatation was then performed over the guidewire. A 10 -French percutaneous nephrostomy tube was then advanced and formed in the collecting system. Catheter position was confirmed by fluoroscopy.  The catheter was secured at the skin with a Prolene retention suture and Stat-Lock device. A gravity bag was placed.  COMPLICATIONS: None.  FINDINGS: Imaging confirms placement of a 10 French right nephrostomy with ultrasound and fluoroscopic guidance  IMPRESSION: Successful right 10 French nephrostomy insertion   Electronically Signed   By: Judie PetitM.  Shick M.D.   On: 05/16/2014 15:09    Labs:  CBC:  Recent Labs  05/15/14 1505 05/15/14 1641 05/15/14 2156 05/16/14 0830  WBC 14.7*  --  15.2* 12.1*  HGB 14.1 15.0 13.9 12.7*  HCT 43.5 44.0 43.3 38.6*  PLT 262  --  271 229    COAGS:  Recent Labs  05/15/14 2156  INR 1.13  APTT 38*    BMP:  Recent Labs  05/15/14 1505 05/15/14 1641 05/15/14 2156 05/15/14 2300 05/16/14 0320 05/16/14 0830 05/17/14 0418  NA 136 136 142  --   --  138 140  K 7.5* 7.1* 6.2* 6.1* 5.2* 5.9* 5.2*  CL 108  --  109  --   --  105 108  CO2 16*  --  20  --   --  21 22  GLUCOSE 152* 128* 152*  --   --  143* 122*  BUN 121*  --  118*  --   --  118* 81*  CALCIUM 9.2  --  9.7  --   --  8.9 8.9  CREATININE 8.53*  --  8.24*  --   --  8.22* 3.84*  GFRNONAA 5*  --  6*  --   --  6* 14*  GFRAA 6*  --  7*  --   --  7* 17*    LIVER FUNCTION TESTS:  Recent Labs  05/15/14 2156  BILITOT 0.4  AST 14  ALT 15  ALKPHOS 61  PROT 7.6  ALBUMIN 4.0    Assessment and Plan: Patient with history of acute renal failure, obstructive right hydronephrosis secondary to distal ureteral stone, status post right percutaneous nephrostomy 05/16/14; creatinine today is decreased to 3.84, previously 8.22; potassium 5.2; urine culture negative; tentatively plan is for antegrade  double-J stent placement in IR on 4/1 to facilitate ureteroscopic stone extraction; details/risks of procedure, including but not limited to internal bleeding, infection, worsening renal failure, inability to place stent, discussed with patient with his understanding and consent. Secondary to known IVP dye allergy patient will be premedicated with 13 hour prednisone/Benadryl prep.   Signed: Chinita PesterALLRED,D KEVIN 05/17/2014, 10:45 AM   I spent a total of 15 minutes in face to face in clinical consultation/evaluation, greater than 50% of which was counseling/coordinating care for right percutaneous nephrostomy placement

## 2014-05-18 ENCOUNTER — Inpatient Hospital Stay (HOSPITAL_COMMUNITY): Payer: Medicare Other

## 2014-05-18 ENCOUNTER — Other Ambulatory Visit: Payer: Self-pay | Admitting: Urology

## 2014-05-18 LAB — CBC
HCT: 39.8 % (ref 39.0–52.0)
Hemoglobin: 12.6 g/dL — ABNORMAL LOW (ref 13.0–17.0)
MCH: 28.9 pg (ref 26.0–34.0)
MCHC: 31.7 g/dL (ref 30.0–36.0)
MCV: 91.3 fL (ref 78.0–100.0)
PLATELETS: 229 10*3/uL (ref 150–400)
RBC: 4.36 MIL/uL (ref 4.22–5.81)
RDW: 12.9 % (ref 11.5–15.5)
WBC: 9.9 10*3/uL (ref 4.0–10.5)

## 2014-05-18 LAB — BASIC METABOLIC PANEL
ANION GAP: 7 (ref 5–15)
BUN: 49 mg/dL — ABNORMAL HIGH (ref 6–23)
CHLORIDE: 111 mmol/L (ref 96–112)
CO2: 22 mmol/L (ref 19–32)
CREATININE: 1.95 mg/dL — AB (ref 0.50–1.35)
Calcium: 8.8 mg/dL (ref 8.4–10.5)
GFR calc Af Amer: 38 mL/min — ABNORMAL LOW (ref >90)
GFR calc non Af Amer: 33 mL/min — ABNORMAL LOW (ref >90)
Glucose, Bld: 190 mg/dL — ABNORMAL HIGH (ref 70–99)
POTASSIUM: 5.3 mmol/L — AB (ref 3.5–5.1)
Sodium: 140 mmol/L (ref 135–145)

## 2014-05-18 LAB — GLUCOSE, CAPILLARY
GLUCOSE-CAPILLARY: 189 mg/dL — AB (ref 70–99)
Glucose-Capillary: 204 mg/dL — ABNORMAL HIGH (ref 70–99)
Glucose-Capillary: 197 mg/dL — ABNORMAL HIGH (ref 70–99)

## 2014-05-18 MED ORDER — CIPROFLOXACIN IN D5W 400 MG/200ML IV SOLN
INTRAVENOUS | Status: AC
  Filled 2014-05-18: qty 200

## 2014-05-18 MED ORDER — FENTANYL CITRATE 0.05 MG/ML IJ SOLN
INTRAMUSCULAR | Status: AC
  Filled 2014-05-18: qty 4

## 2014-05-18 MED ORDER — CIPROFLOXACIN IN D5W 400 MG/200ML IV SOLN
400.0000 mg | Freq: Once | INTRAVENOUS | Status: AC
  Administered 2014-05-18: 400 mg via INTRAVENOUS
  Filled 2014-05-18: qty 200

## 2014-05-18 MED ORDER — IOHEXOL 300 MG/ML  SOLN
20.0000 mL | Freq: Once | INTRAMUSCULAR | Status: AC | PRN
  Administered 2014-05-18: 1 mL

## 2014-05-18 MED ORDER — FENTANYL CITRATE 0.05 MG/ML IJ SOLN
INTRAMUSCULAR | Status: AC | PRN
  Administered 2014-05-18: 25 ug via INTRAVENOUS
  Administered 2014-05-18: 50 ug via INTRAVENOUS

## 2014-05-18 MED ORDER — MIDAZOLAM HCL 2 MG/2ML IJ SOLN
INTRAMUSCULAR | Status: AC
  Filled 2014-05-18: qty 6

## 2014-05-18 MED ORDER — LIDOCAINE HCL 1 % IJ SOLN
INTRAMUSCULAR | Status: AC
  Filled 2014-05-18: qty 20

## 2014-05-18 MED ORDER — MIDAZOLAM HCL 2 MG/2ML IJ SOLN
INTRAMUSCULAR | Status: AC | PRN
  Administered 2014-05-18 (×2): 0.5 mg via INTRAVENOUS
  Administered 2014-05-18: 1 mg via INTRAVENOUS

## 2014-05-18 NOTE — Progress Notes (Signed)
Progress Note   Kevin Jenkins DOB: 06-Apr-1940 DOA: 05/15/2014 PCP: No primary care provider on file.   Brief Narrative:   Kevin Jenkins is an 74 y.o. male with a PMH of hypertension, diabetes, stage III-4 chronic kidney disease, tobacco abuse, and nephrolithiasis who was admitted 05/15/14 for surgical resection of a 13 mm ureteral stone. The perioperative area, the patient was found to have a WBC of 14.5 with acute on chronic renal failure (creatinine 8.53) and a potassium of 7.5. Surgery was canceled and the patient was referred for admission.  Assessment/Plan:   Principal Problem:   Acute renal failure secondary to right obstructive uropathy/right hydronephrosis/right ureterolithiasis in the setting of chronic kidney disease - Acute renal failure multifactorial with ACE inhibitor, dehydration, right hydronephrosis CONTRIBUTORY. - Status post right nephrostomy tube placement. - Continue IV fluids. Creatinine down to 1.95. Baseline creatinine 1.2.  Active Problems:   Hyperkalemia - Triggered by worsening renal failure and ACE inhibitor therapy. - Status post stabilization of hyperkalemia with insulin/dextrose, IV fluids and Kayexalate.    Diabetes - Hemoglobin A1c 8.3%. - Currently being managed with moderate scale SSI. CBGs 118-189.    Benign essential HTN - ACE inhibitor on hold. Continue amlodipine. - Continue labetalol as needed.    Leukocytosis - Resolved. Likely a stress reaction. - Urine culture negative, status post IV Rocephin 05/15/14 through 05/17/14.    Right ureteral stone - For right antegrade ureteral stent placement per urology. Surgical removal planned for 05/19/14.    Metabolic acidosis - Secondary to acute renal failure, resolved.    DVT Prophylaxis - SCDs ordered.  Code Status: Full. Family Communication: No family currently at the bedside. Disposition Plan: Home when stable.   IV Access:    Peripheral IV   Procedures and  diagnostic studies:   [place] Renal 05/15/2014: 1. Moderate right-sided hydroureteronephrosis concerning for obstructive stone in the distal right ureter. Correlation with noncontrast CT scan could provide additional diagnostic information if clinically appropriate. 2. Other large stones are present within the right renal collecting system, measuring up to 2 cm in the lower pole. 3. Left kidney is remarkable for diffuse cortical thinning and increased echogenicity, suggesting underlying medical renal disease. 4. Large exophytic simple cyst in the lower pole of the right kidney incidentally noted.    Ir Nephrostomy Placement Right 05/16/2014: Successful right 10 French nephrostomy insertion      Medical Consultants:    Dr. Jethro BolusSigmund Tannenbaum, Urology  Anti-Infectives:    Rocephin 05/15/14---> 05/17/14  Subjective:    Kevin Jenkins denies nausea, vomiting, or pain. His mouth is dry as he has just come back from having his stent placed. He is a bit groggy but otherwise without complaints.  Objective:    Filed Vitals:   05/17/14 2105 05/17/14 2142 05/18/14 0148 05/18/14 0547  BP: 181/77 160/96 157/89 171/86  Pulse: 80 80 70 67  Temp: 97.6 F (36.4 C)  98 F (36.7 C) 98.1 F (36.7 C)  TempSrc: Oral  Oral Oral  Resp: 17 17 16 17   Height:      Weight:      SpO2: 98% 97% 96% 97%    Intake/Output Summary (Last 24 hours) at 05/18/14 0859 Last data filed at 05/18/14 [id number]  Gross per 24 hour  Intake    890 ml  Output   5595 ml  Net  -4705 ml    Exam: Gen:  NAD, groggy Cardiovascular:  RRR, No M/R/G Respiratory:  Lungs  CTAB Gastrointestinal:  Abdomen soft, NT/ND, + BS Extremities:  No C/E/C   Data Reviewed:    Labs: Basic Metabolic Panel:  Recent Labs Lab 05/15/14 1505 05/15/14 1641 05/15/14 2156  05/16/14 0830 05/17/14 0418 05/18/14 0338  NA 136 136 142  --  138 140 140  K 7.5* 7.1* 6.2*  < > 5.9* 5.2* 5.3*  CL 108  --  109  --  105 108 111  CO2 16*  --  20  --   GLUCOSE 152* 128* 152*  --  143* 122* 190*  BUN 121*  --  118*  --  118* 81* 49*  CREATININE 8.53*  --  8.24*  --  8.22* 3.84* 1.95*  CALCIUM 9.2  --  9.7  --  8.9 8.9 8.8  MG  --   --  2.6*  --   --   --   --   PHOS  --   --  8.9*  --   --   --   --   < > = values in this interval not displayed. GFR Estimated Creatinine Clearance: 44.3 mL/min (by C-G formula based on Cr of 1.95). Liver Function Tests:  Recent Labs Lab 05/15/14 2156  AST 14  ALT 15  ALKPHOS 61  BILITOT 0.4  PROT 7.6  ALBUMIN 4.0   Coagulation profile  Recent Labs Lab 05/15/14 2156  INR 1.13    CBC:  Recent Labs Lab 05/15/14 1505 05/15/14 1641 05/15/14 2156 05/16/14 0830 05/18/14 0338  WBC 14.7*  --  15.2* 12.1* 9.9  HGB 14.1 15.0 13.9 12.7* 12.6*  HCT 43.5 44.0 43.3 38.6* 39.8  MCV 90.8  --  91.2 90.2 91.3  PLT 262  --  271 229 229   CBG:  Recent Labs Lab 05/17/14 0732 05/17/14 1152 05/17/14 1704 05/17/14 2104 05/18/14 0759  GLUCAP 124* 154* 141* 118* 189*   Hgb A1c:  Recent Labs  05/15/14 2156  HGBA1C 8.3*    Recent Results (from the past 240 hour(s))  Urine culture     Status: None   Collection Time: 05/15/14 10:32 PM  Result Value Ref Range Status   Specimen Description URINE, CLEAN CATCH  Final   Special Requests NONE  Final   Colony Count NO GROWTH Performed at [facility]   Final   Culture NO GROWTH Performed at [facility]   Final   Report Status 05/17/2014 FINAL  Final  Culture, Urine     Status: None   Collection Time: 05/16/14 12:43 PM  Result Value Ref Range Status   Specimen Description URINE, CLEAN CATCH  Final   Special Requests NONE  Final   Colony Count NO GROWTH Performed at [facility]   Final   Culture NO GROWTH Performed at [facility]   Final   Report Status 05/17/2014 FINAL  Final  Surgical pcr screen     Status: None   Collection Time: 05/17/14  9:02 PM  Result Value Ref Range Status   MRSA,  PCR NEGATIVE NEGATIVE Final   Staphylococcus aureus NEGATIVE NEGATIVE Final    Comment:        The Xpert SA Assay (FDA approved for NASAL specimens in patients over 31 years of age), is one component of a comprehensive surveillance program.  Test performance has been validated by [facility] for patients greater than or equal to 41 year old. It is not intended to diagnose infection nor to guide  or monitor treatment.      Medications:   . albuterol  10 mg Nebulization Once  . amLODipine  5 mg Oral Daily  . aspirin EC  81 mg Oral Daily  . diphenhydrAMINE  50 mg Oral Once  . insulin aspart  0-15 Units Subcutaneous TID WC  . polyethylene glycol  17 g Oral Daily  . predniSONE  50 mg Oral Q6H  . sodium chloride  3 mL Intravenous Q12H   Continuous Infusions: . sodium chloride 100 mL/hr at 05/18/14 0508    Time spent: 25 minutes.   LOS: 3 days   Trishna Cwik  [facility] Pager [phone]. If unable to reach me by pager, please call my cell phone at [phone].  *Please refer to [email or web address], password [email or web address] to get updated schedule on who will round on this patient, as hospitalists switch teams weekly. If 7PM-7AM, please contact night-coverage at [email or web address], password [email or web address] for any overnight needs.  05/18/2014, 8:59 AM

## 2014-05-18 NOTE — Progress Notes (Signed)
Subjective: Doing well, feeling much better with PCN. UCx negative. Cr down to 2, hyperK improved. NPO for antegrade stent today. Post-obstructive diuresis continues w/>5L UOP yesterday, however appears physiologic.   Objective: Vital signs in last 24 hours: Temp:  [97.6 F (36.4 C)-98.1 F (36.7 C)] 98.1 F (36.7 C) (04/01 0547) Pulse Rate:  [67-80] 67 (04/01 0547) Resp:  [16-17] 17 (04/01 0547) BP: (157-181)/(77-96) 171/86 mmHg (04/01 0547) SpO2:  [96 %-98 %] 97 % (04/01 0547)  Intake/Output from previous day: 03/31 0701 - 04/01 0700 In: 1130 [P.O.:720; I.V.:400] Out: 5100 [Urine:5100]  Physical Exam:  General: Alert and oriented CV: RRR Lungs: Clear Abdomen: Soft, ND Ext: NT, No erythema  Lab Results:  Recent Labs  05/15/14 2156 05/16/14 0830 05/18/14 0338  HGB 13.9 12.7* 12.6*  HCT 43.3 38.6* 39.8   BMET  Recent Labs  05/17/14 0418 05/18/14 0338  NA 140 140  K 5.2* 5.3*  CL 108 111  CO2 22 22  GLUCOSE 122* 190*  BUN 81* 49*  CREATININE 3.84* 1.95*  CALCIUM 8.9 8.8     Studies/Results: Ir Nephrostomy Placement Right  05/16/2014   CLINICAL DATA:  Acute on chronic renal failure, right hydronephrosis  EXAM: 1. ULTRASOUND GUIDANCE FOR PUNCTURE OF THE RIGHT RENAL COLLECTING SYSTEM. 2. RIGHT PERCUTANEOUS NEPHROSTOMY TUBE PLACEMENT.  COMPARISON:  05/15/2014  ANESTHESIA/SEDATION: 1.0 mg IV Versed; 50 mcg IV Fentanyl.  Total Moderate Sedation Time  Fifteen minutes  CONTRAST:  None.  MEDICATIONS: 400 mg Cipro administered within 1 hour of the procedure. Antibiotic was administered in an appropriate time frame prior to skin puncture.  FLUOROSCOPY TIME:  1 minutes 12 seconds  PROCEDURE: The procedure, risks, benefits, and alternatives were explained to the patient. Questions regarding the procedure were encouraged and answered. The patient understands and consents to the procedure.  The right flank region was prepped with Betadine in a sterile fashion, and a sterile  drape was applied covering the operative field. A sterile gown and sterile gloves were used for the procedure. Local anesthesia was provided with 1% Lidocaine.  Ultrasound was used to localize the right kidney. Under direct ultrasound guidance, a 21 gauge needle was advanced into the renal collecting system. Ultrasound image documentation was performed. Aspiration of urine sample was performed followed by contrast injection.  A transitional dilator was advanced over a guidewire. Percutaneous tract dilatation was then performed over the guidewire. A 10 -French percutaneous nephrostomy tube was then advanced and formed in the collecting system. Catheter position was confirmed by fluoroscopy.  The catheter was secured at the skin with a Prolene retention suture and Stat-Lock device. A gravity bag was placed.  COMPLICATIONS: None.  FINDINGS: Imaging confirms placement of a 10 French right nephrostomy with ultrasound and fluoroscopic guidance  IMPRESSION: Successful right 10 French nephrostomy insertion   Electronically Signed   By: Judie Petit.  Shick M.D.   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   -- Right antegrade ureteral stent today, to facilitate safer USE of large, likely impacted distal ureteral stone and renal stones tentatively scheduled for Saturday am. Will make NPO again at midnight tonight. -- UCx negative. On IV rocephin -- INR WNL, NPO as of this am. -- Will continue to follow along. Engineer, manufacturing of medical  issues.     LOS: 3 days   Legacy Carrender C 05/18/2014, 7:09 AM

## 2014-05-18 NOTE — Procedures (Signed)
Successful RT 10 X 26 URETERAL STENT INSERTION NO COMP STABLE PCN REMOVED FULL REPORT IN PACS

## 2014-05-19 ENCOUNTER — Inpatient Hospital Stay (HOSPITAL_COMMUNITY): Payer: Medicare Other | Admitting: Anesthesiology

## 2014-05-19 ENCOUNTER — Encounter (HOSPITAL_COMMUNITY)
Admission: RE | Disposition: A | Discharge status: Home / Self Care | Payer: Self-pay | Source: Ambulatory Visit | Attending: Internal Medicine

## 2014-05-19 ENCOUNTER — Encounter (HOSPITAL_COMMUNITY): Payer: Self-pay | Admitting: Anesthesiology

## 2014-05-19 HISTORY — PX: CYSTOSCOPY WITH RETROGRADE PYELOGRAM, URETEROSCOPY AND STENT PLACEMENT: SHX5789

## 2014-05-19 LAB — BASIC METABOLIC PANEL
Anion gap: 12 (ref 5–15)
BUN: 40 mg/dL — AB (ref 6–23)
CO2: 22 mmol/L (ref 19–32)
CREATININE: 1.78 mg/dL — AB (ref 0.50–1.35)
Calcium: 8.7 mg/dL (ref 8.4–10.5)
Chloride: 106 mmol/L (ref 96–112)
GFR calc non Af Amer: 36 mL/min — ABNORMAL LOW (ref >90)
GFR, EST AFRICAN AMERICAN: 42 mL/min — AB (ref >90)
GLUCOSE: 165 mg/dL — AB (ref 70–99)
Potassium: 5.3 mmol/L — ABNORMAL HIGH (ref 3.5–5.1)
Sodium: 140 mmol/L (ref 135–145)

## 2014-05-19 LAB — GLUCOSE, CAPILLARY
Glucose-Capillary: 150 mg/dL — ABNORMAL HIGH (ref 70–99)
Glucose-Capillary: 147 mg/dL — ABNORMAL HIGH (ref 70–99)
Glucose-Capillary: 171 mg/dL — ABNORMAL HIGH (ref 70–99)

## 2014-05-19 SURGERY — CYSTOURETEROSCOPY, WITH RETROGRADE PYELOGRAM AND STENT INSERTION
Anesthesia: General | Site: Ureter | Laterality: Right

## 2014-05-19 MED ORDER — FENTANYL CITRATE 0.05 MG/ML IJ SOLN
INTRAMUSCULAR | Status: AC
  Filled 2014-05-19: qty 2

## 2014-05-19 MED ORDER — ONDANSETRON HCL 4 MG/2ML IJ SOLN
INTRAMUSCULAR | Status: DC | PRN
  Administered 2014-05-19: 4 mg via INTRAVENOUS

## 2014-05-19 MED ORDER — PROPOFOL 10 MG/ML IV BOLUS
INTRAVENOUS | Status: DC | PRN
  Administered 2014-05-19: 200 mg via INTRAVENOUS

## 2014-05-19 MED ORDER — MIDAZOLAM HCL 2 MG/2ML IJ SOLN: 0.5000 mg | Freq: Once | INTRAMUSCULAR | Status: AC | PRN

## 2014-05-19 MED ORDER — CIPROFLOXACIN IN D5W 400 MG/200ML IV SOLN
INTRAVENOUS | Status: DC | PRN
  Administered 2014-05-19: 400 mg via INTRAVENOUS

## 2014-05-19 MED ORDER — LIDOCAINE HCL (CARDIAC) 20 MG/ML IV SOLN
INTRAVENOUS | Status: DC | PRN
  Administered 2014-05-19: 30 mg via INTRAVENOUS

## 2014-05-19 MED ORDER — BELLADONNA ALKALOIDS-OPIUM 16.2-60 MG RE SUPP
RECTAL | Status: DC | PRN
  Administered 2014-05-19: 1 via RECTAL

## 2014-05-19 MED ORDER — PHENYLEPHRINE HCL 10 MG/ML IJ SOLN
INTRAMUSCULAR | Status: DC | PRN
  Administered 2014-05-19 (×2): 80 ug via INTRAVENOUS

## 2014-05-19 MED ORDER — SODIUM CHLORIDE 0.9 % IJ SOLN
INTRAMUSCULAR | Status: DC | PRN
  Administered 2014-05-19: 20 mL

## 2014-05-19 MED ORDER — FENTANYL CITRATE 0.05 MG/ML IJ SOLN
INTRAMUSCULAR | Status: DC | PRN
Start: 2014-05-19 — End: 2014-05-19
  Administered 2014-05-19 (×4): 50 ug via INTRAVENOUS

## 2014-05-19 MED ORDER — MEPERIDINE HCL 25 MG/ML IJ SOLN: 6.2500 mg | INTRAMUSCULAR | Status: DC | PRN

## 2014-05-19 MED ORDER — PHENYLEPHRINE 40 MCG/ML (10ML) SYRINGE FOR IV PUSH (FOR BLOOD PRESSURE SUPPORT)
PREFILLED_SYRINGE | INTRAVENOUS | Status: AC
  Filled 2014-05-19: qty 10

## 2014-05-19 MED ORDER — GLYCOPYRROLATE 0.2 MG/ML IJ SOLN
INTRAMUSCULAR | Status: DC | PRN
  Administered 2014-05-19: 0.2 mg via INTRAVENOUS

## 2014-05-19 MED ORDER — BELLADONNA ALKALOIDS-OPIUM 16.2-60 MG RE SUPP
RECTAL | Status: AC
  Filled 2014-05-19: qty 1

## 2014-05-19 MED ORDER — STERILE WATER FOR IRRIGATION IR SOLN
Status: DC | PRN
  Administered 2014-05-19: 2000 mL

## 2014-05-19 MED ORDER — CIPROFLOXACIN IN D5W 400 MG/200ML IV SOLN
INTRAVENOUS | Status: AC
  Filled 2014-05-19: qty 200

## 2014-05-19 MED ORDER — FENTANYL CITRATE 0.05 MG/ML IJ SOLN
25.0000 ug | INTRAMUSCULAR | Status: DC | PRN
  Administered 2014-05-19 (×2): 50 ug via INTRAVENOUS

## 2014-05-19 MED ORDER — SODIUM POLYSTYRENE SULFONATE 15 GM/60ML PO SUSP
15.0000 g | Freq: Once | ORAL | Status: AC
  Administered 2014-05-19: 15 g via ORAL
  Filled 2014-05-19: qty 60

## 2014-05-19 MED ORDER — ONDANSETRON HCL 4 MG/2ML IJ SOLN
INTRAMUSCULAR | Status: AC
  Filled 2014-05-19: qty 2

## 2014-05-19 MED ORDER — PROMETHAZINE HCL 25 MG/ML IJ SOLN: 6.2500 mg | INTRAMUSCULAR | Status: DC | PRN

## 2014-05-19 SURGICAL SUPPLY — 22 items
BAG URO CATCHER STRL LF (DRAPE) ×3 IMPLANT
CATH CLEAR GEL 3F BACKSTOP (CATHETERS) ×3 IMPLANT
CATH INTERMIT  6FR 70CM (CATHETERS) ×3 IMPLANT
CLOTH BEACON ORANGE TIMEOUT ST (SAFETY) ×3 IMPLANT
EXTRACTOR STONE NITINOL NGAGE (UROLOGICAL SUPPLIES) ×3 IMPLANT
FIBER LASER FLEXIVA 1000 (UROLOGICAL SUPPLIES) IMPLANT
FIBER LASER FLEXIVA 200 (UROLOGICAL SUPPLIES) ×3 IMPLANT
FIBER LASER FLEXIVA 365 (UROLOGICAL SUPPLIES) IMPLANT
FIBER LASER FLEXIVA 550 (UROLOGICAL SUPPLIES) IMPLANT
FIBER LASER TRAC TIP (UROLOGICAL SUPPLIES) IMPLANT
GLOVE BIOGEL M STRL SZ7.5 (GLOVE) ×3 IMPLANT
GOWN STRL REUS W/TWL XL LVL3 (GOWN DISPOSABLE) ×3 IMPLANT
GUIDEWIRE STR DUAL SENSOR (WIRE) ×3 IMPLANT
KIT BERKELEY 1ST TRIMESTER 3/8 (MISCELLANEOUS) ×3 IMPLANT
MANIFOLD NEPTUNE II (INSTRUMENTS) ×3 IMPLANT
NS IRRIG 1000ML POUR BTL (IV SOLUTION) ×3 IMPLANT
PACK CYSTO (CUSTOM PROCEDURE TRAY) ×3 IMPLANT
SCRUB PCMX 4 OZ (MISCELLANEOUS) ×3 IMPLANT
SHIELD EYE BINOCULAR (MISCELLANEOUS) IMPLANT
STENT CONTOUR 6FRX26X.038 (STENTS) ×3 IMPLANT
TUBING CONNECTING 10 (TUBING) ×2 IMPLANT
TUBING CONNECTING 10' (TUBING) ×1

## 2014-05-19 NOTE — Anesthesia Preprocedure Evaluation (Addendum)
Anesthesia Evaluation  Patient identified by MRN, date of birth, ID band Patient awake    Reviewed: Allergy & Precautions, NPO status , Patient's Chart, lab work & pertinent test results  History of Anesthesia Complications Negative for: history of anesthetic complications  Airway Mallampati: I  TM Distance: >3 FB Neck ROM: Full    Dental  (+) Teeth Intact, Dental Advisory Given   Pulmonary former smoker,  breath sounds clear to auscultation        Cardiovascular hypertension, Pt. on medications - anginaRhythm:Regular Rate:Normal     Neuro/Psych    GI/Hepatic negative GI ROS,   Endo/Other  diabetes (glu 165), Oral Hypoglycemic AgentsMorbid obesity  Renal/GU ARF and Renal InsufficiencyRenal disease (creat 1.78, K+ 5.3)     Musculoskeletal   Abdominal (+) + obese,   Peds  Hematology negative hematology ROS (+)   Anesthesia Other Findings   Reproductive/Obstetrics                            Anesthesia Physical Anesthesia Plan  ASA: III  Anesthesia Plan: General   Post-op Pain Management:    Induction: Intravenous  Airway Management Planned: LMA  Additional Equipment:   Intra-op Plan:   Post-operative Plan:   Informed Consent: I have reviewed the patients History and Physical, chart, labs and discussed the procedure including the risks, benefits and alternatives for the proposed anesthesia with the patient or authorized representative who has indicated his/her understanding and acceptance.   Dental advisory given  Plan Discussed with: CRNA and Surgeon  Anesthesia Plan Comments: (Plan routine monitors, GA- LMA OK)        Anesthesia Quick Evaluation

## 2014-05-19 NOTE — Transfer of Care (Signed)
Immediate Anesthesia Transfer of Care Note  Patient: Kevin CapriceRuben B Jenkins  Procedure(s) Performed: Procedure(s) (LRB): CYSTOSCOPY WITH RETROGRADE PYELOGRAM, RIGHT URETEROSCOPY LASER  AND REMOVED JJ STENT, REPLACED JJ STENT  PLACEMENT (Right)  Patient Location: PACU  Anesthesia Type: General  Level of Consciousness: sedated, patient cooperative and responds to stimulation  Airway & Oxygen Therapy: Patient Spontanous Breathing and Patient connected to face mask oxgen  Post-op Assessment: Report given to PACU RN and Post -op Vital signs reviewed and stable  Post vital signs: Reviewed and stable  Complications: No apparent anesthesia complications

## 2014-05-19 NOTE — Anesthesia Postprocedure Evaluation (Signed)
  Anesthesia Post-op Note  Patient: Kevin CapriceRuben B Jenkins  Procedure(s) Performed: Procedure(s): CYSTOSCOPY WITH RETROGRADE PYELOGRAM, RIGHT URETEROSCOPY LASER  AND REMOVED JJ STENT, REPLACED JJ STENT  PLACEMENT (Right)  Patient Location: PACU  Anesthesia Type:General  Level of Consciousness: awake, alert , oriented and patient cooperative  Airway and Oxygen Therapy: Patient Spontanous Breathing and Patient connected to nasal cannula oxygen  Post-op Pain: mild  Post-op Assessment: Post-op Vital signs reviewed, Patient's Cardiovascular Status Stable, Respiratory Function Stable, Patent Airway, No signs of Nausea or vomiting and Pain level controlled  Post-op Vital Signs: Reviewed and stable  Last Vitals:  Filed Vitals:   05/19/14 1105  BP:   Pulse: 70  Temp:   Resp: 17    Complications: No apparent anesthesia complications

## 2014-05-19 NOTE — Progress Notes (Signed)
Progress Note   Kevin CapriceRuben B Ernandez WUJ:[id number] DOB: August 15, 1940 DOA: 05/15/2014 PCP: No primary care provider on file.   Brief Narrative:   Kevin Jenkins is an 74 y.o. male with a PMH of hypertension, diabetes, stage III-4 chronic kidney disease, tobacco abuse, and nephrolithiasis who was admitted 05/15/14 for surgical resection of a 13 mm ureteral stone. The perioperative area, the patient was found to have a WBC of 14.5 with acute on chronic renal failure (creatinine 8.53) and a potassium of 7.5. Surgery was canceled and the patient was referred for admission.  Assessment/Plan:   Principal Problem:   Acute renal failure secondary to right obstructive uropathy/right hydronephrosis/right ureterolithiasis in the setting of chronic kidney disease - Acute renal failure multifactorial with ACE inhibitor, dehydration, right hydronephrosis contributory. - Status post right nephrostomy tube placement 05/16/14 and right ureteral stent 05/18/14. - Continue IV fluids. Creatinine down to 1.78. Baseline creatinine 1.2.  Active Problems:   Hyperkalemia - Triggered by worsening renal failure and ACE inhibitor therapy. - Status post stabilization of hyperkalemia with insulin/dextrose, IV fluids and Kayexalate. - Potassium 5.3 today. Will give 15 g of Kayexalate.    Diabetes - Hemoglobin A1c 8.3%. - Currently being managed with moderate scale SSI. CBGs [id number].    Benign essential HTN - ACE inhibitor on hold. Continue amlodipine. - Continue labetalol as needed.    Leukocytosis - Resolved. Likely a stress reaction. - Urine culture negative, status post IV Rocephin 05/15/14 through 05/17/14.    Right ureteral stone - Status post right antegrade ureteral stent placement 05/18/14. Surgical removal planned for 05/19/14.    Metabolic acidosis - Secondary to acute renal failure, resolved.    DVT Prophylaxis - SCDs ordered.  Code Status: Full. Family Communication: Multiple family updated at the  bedside. Disposition Plan: Home when stable.   IV Access:    Peripheral IV   Procedures and diagnostic studies:   [place] Renal 05/15/2014: 1. Moderate right-sided hydroureteronephrosis concerning for obstructive stone in the distal right ureter. Correlation with noncontrast CT scan could provide additional diagnostic information if clinically appropriate. 2. Other large stones are present within the right renal collecting system, measuring up to 2 cm in the lower pole. 3. Left kidney is remarkable for diffuse cortical thinning and increased echogenicity, suggesting underlying medical renal disease. 4. Large exophytic simple cyst in the lower pole of the right kidney incidentally noted.    Ir Nephrostomy Placement Right 05/16/2014: Successful right 10 French nephrostomy insertion     Ir Ureteral Stent Placement Existing Access Right 05/18/2014: Successful insertion of a right 10 x 26 ureteral stent from an antegrade approach.  Right nephrostomy removed    Medical Consultants:    Dr. Jethro BolusSigmund Tannenbaum, Urology  Interventional Radiology  Anti-Infectives:    Rocephin 05/15/14---> 05/17/14  Subjective:   Kevin Capriceuben B Vierra denies nausea, vomiting, or pain. He has eaten lunch and is without complaints. Anticipates being discharged tomorrow.  Objective:    Filed Vitals:   05/18/14 1633 05/18/14 1715 05/18/14 2056 05/19/14 0449  BP: 189/89 170/90 165/94 164/91  Pulse: 95  96 67  Temp:   97.8 F (36.6 C) 97.6 F (36.4 C)  TempSrc:   Axillary Oral  Resp:   16 17  Height:      Weight:      SpO2:   98% 99%    Intake/Output Summary (Last 24 hours) at 05/19/14 0837 Last data filed at 05/19/14 0750  Gross per 24 hour  Intake 3794.67  ml  Output   3470 ml  Net 324.67 ml    Exam: Gen:  NAD Cardiovascular:  RRR, No M/R/G Respiratory:  Lungs CTAB Gastrointestinal:  Abdomen soft, NT/ND, + BS Extremities:  No C/E/C   Data Reviewed:    Labs: Basic Metabolic Panel:  Recent  Labs Lab 05/15/14 2156  05/16/14 0830 05/17/14 0418 05/18/14 0338 05/19/14 0453  NA 142  --  138 140 140 140  K 6.2*  < > 5.9* 5.2* 5.3* 5.3*  CL 109  --  105 108 111 106  CO2 20  --  GLUCOSE 152*  --  143* 122* 190* 165*  BUN 118*  --  118* 81* 49* 40*  CREATININE 8.24*  --  8.22* 3.84* 1.95* 1.78*  CALCIUM 9.7  --  8.9 8.9 8.8 8.7  MG 2.6*  --   --   --   --   --   PHOS 8.9*  --   --   --   --   --   < > = values in this interval not displayed. GFR Estimated Creatinine Clearance: 48.5 mL/min (by C-G formula based on Cr of 1.78). Liver Function Tests:  Recent Labs Lab 05/15/14 2156  AST 14  ALT 15  ALKPHOS 61  BILITOT 0.4  PROT 7.6  ALBUMIN 4.0   Coagulation profile  Recent Labs Lab 05/15/14 2156  INR 1.13    CBC:  Recent Labs Lab 05/15/14 1505 05/15/14 1641 05/15/14 2156 05/16/14 0830 05/18/14 0338  WBC 14.7*  --  15.2* 12.1* 9.9  HGB 14.1 15.0 13.9 12.7* 12.6*  HCT 43.5 44.0 43.3 38.6* 39.8  MCV 90.8  --  91.2 90.2 91.3  PLT 262  --  271 229 229   CBG:  Recent Labs Lab 05/17/14 1704 05/17/14 2104 05/18/14 0759 05/18/14 1639 05/18/14 2131  GLUCAP 141* 118* 189* 204* 197*   Hgb A1c: No results for input(s): HGBA1C in the last 72 hours.  Recent Results (from the past 240 hour(s))  Urine culture     Status: None   Collection Time: 05/15/14 10:32 PM  Result Value Ref Range Status   Specimen Description URINE, CLEAN CATCH  Final   Special Requests NONE  Final   Colony Count NO GROWTH Performed at [facility]   Final   Culture NO GROWTH Performed at [facility]   Final   Report Status 05/17/2014 FINAL  Final  Culture, Urine     Status: None   Collection Time: 05/16/14 12:43 PM  Result Value Ref Range Status   Specimen Description URINE, CLEAN CATCH  Final   Special Requests NONE  Final   Colony Count NO GROWTH Performed at [facility]   Final   Culture NO GROWTH Performed at [facility]   Final   Report Status 05/17/2014 FINAL  Final  Surgical pcr screen     Status: None   Collection Time: 05/17/14  9:02 PM  Result Value Ref Range Status   MRSA, PCR NEGATIVE NEGATIVE Final   Staphylococcus aureus NEGATIVE NEGATIVE Final    Comment:        The Xpert SA Assay (FDA approved for NASAL specimens in patients over 42 years of age), is one component of a comprehensive surveillance program.  Test performance has been validated by [facility] for patients greater than or equal to 55 year old. It is not intended to diagnose infection nor to guide or  monitor treatment.      Medications:   . amLODipine  5 mg Oral Daily  . aspirin EC  81 mg Oral Daily  . insulin aspart  0-15 Units Subcutaneous TID WC  . polyethylene glycol  17 g Oral Daily  . sodium chloride  3 mL Intravenous Q12H   Continuous Infusions: . sodium chloride 100 mL/hr at 05/19/14 0319    Time spent: 25 minutes.   LOS: 4 days   Koron Godeaux  [facility] Pager [phone]. If unable to reach me by pager, please call my cell phone at [phone].  *Please refer to [email or web address], password [email or web address] to get updated schedule on who will round on this patient, as hospitalists switch teams weekly. If 7PM-7AM, please contact night-coverage at [email or web address], password [email or web address] for any overnight needs.  05/19/2014, 8:37 AM

## 2014-05-19 NOTE — Op Note (Signed)
Preoperative diagnosis: Right ureteral calculus  Postoperative diagnosis: Right  ureteral calculus  Procedure:  1. Cystoscopy with Right stent removal 2. Right ureteroscopy and stone removal 3. Ureteroscopic laser lithotripsy 4. Right ureteral stent placement (6Fr x 26 cm) 5. Right retrograde pyelography with interpretation  Surgeon: Dr. Alexis FrockSig Tannenbaum  Resident: Elon Jesteravid C. Orelia Brandstetter, MD  Anesthesia: General  Complications: None  Intraoperative findings: Right retrograde pyelography demonstrated hydronephrosis proximal and distal to the stone with a filling defect at the level of the expected stone. No extravasation at the end of the case. Stent in excellent position with good curl in the renal pelvis and in bladder.  EBL: Minimal  Specimens: 1. Right ureteral calculus  Disposition of specimens: [facility] for stone analysis  Indication: Kevin CapriceRuben B Jenkins is a 74 y.o. male patient with urolithiasis. After reviewing the management options for treatment, they elected to proceed with the above surgical procedure(s). We have discussed the potential benefits and risks of the procedure, side effects of the proposed treatment, the likelihood of the patient achieving the goals of the procedure, and any potential problems that might occur during the procedure or recuperation. Informed consent has been obtained.  Description of procedure:  The patient was taken to the operating room and general anesthesia was induced.  The patient was placed in the dorsal lithotomy position, prepped and draped in the usual sterile fashion, and preoperative antibiotics were administered. A preoperative time-out was performed.   Cystourethroscopy was performed.  The patient's urethra was examined and was normal. The bladder was then systematically examined in its entirety. There was no evidence for any bladder tumors, stones, or other mucosal pathology.    Attention then turned to the Right  ureteral orifice where the existing ureteral stent was grasped and pulled to the meatus. A 0.38 sensor guidewire was advanced through the stent up to the renal pelvis under fluoroscopic guidance. A 6Fr open ended was advanced over this wire, through which Omnipaque contrast was injected to perform the retrograde pyelogram with findings as dictated above.  The sensor guidewire was replaced and a semirigid ureteroscope was advanced over the guide wire up to the stone. A Backstop was deployed proximal to the stone. The calculus was fragmented with the 200 micron holmium laser fiber on a setting of 0.8J and frequency of 10 Hz.   All sizable stones were then removed with a Engage basket.  Reinspection of the ureter revealed no remaining visible stones or fragments of significant size.   The safety wire was then replaced and the guidewire was backloaded through the cystoscope and a ureteral stent was advance over the wire using Seldinger technique.  The stent was positioned appropriately under fluoroscopic and cystoscopic guidance.  The wire was then removed with an adequate stent curl noted in the renal pelvis as well as in the bladder.  The bladder was then emptied and the stones were collected and sent for analysis. The patient appeared to tolerate the procedure well and without complications.  The patient was able to be awakened and transferred to the recovery unit in satisfactory condition.

## 2014-05-19 NOTE — Progress Notes (Signed)
Subjective: Doing well, feeling much improved. To OR this morning. Antegrade stent placed and PCN removed.   Objective: Vital signs in last 24 hours: Temp:  [97.3 F (36.3 C)-97.9 F (36.6 C)] 97.6 F (36.4 C) (04/02 0449) Pulse Rate:  [14-105] 67 (04/02 0449) Resp:  [12-18] 17 (04/02 0449) BP: (130-189)/(89-149) 164/91 mmHg (04/02 0449) SpO2:  [94 %-100 %] 99 % (04/02 0449)  Intake/Output from previous day: 04/01 0701 - 04/02 0700 In: 3794.7 [P.O.:240; I.V.:3554.7] Out: 3565 [Urine:3565]  Physical Exam:  General: Alert and oriented CV: RRR Lungs: Clear Abdomen: Soft, ND Ext: NT, No erythema  Lab Results:  Recent Labs  05/18/14 0338  HGB 12.6*  HCT 39.8   BMET  Recent Labs  05/18/14 0338 05/19/14 0453  NA 140 140  K 5.3* 5.3*  CL 111 106  CO2 22 22  GLUCOSE 190* 165*  BUN 49* 40*  CREATININE 1.95* 1.78*  CALCIUM 8.8 8.7     Studies/Results: Ir Ureteral Stent Placement Existing Access Right  05/18/2014   CLINICAL DATA:  Obstructing distal ureteral calculus, right hydroureteronephrosis, status post nephrostomy insertion 05/16/2014  EXAM: FLUOROSCOPIC RIGHT URETERAL STENT INSERTION  ANTEGRADE NEPHROSTOGRAM  RIGHT NEPHROSTOMY REMOVAL  Date:  4/1/20164/02/2014 12:59 pm  Radiologist:  M. Ruel Favorsrevor Shick, MD  Guidance:  Fluoroscopic  FLUOROSCOPY TIME:  4 minutes 42 seconds, 77 mGy  MEDICATIONS AND MEDICAL HISTORY: 2 mg Versed, 75 mcg fentanyl, 400 mg Cipro administered within 1 hour of the procedure  ANESTHESIA/SEDATION: 15 minutes  CONTRAST:  10mL OMNIPAQUE IOHEXOL 300 MG/ML  SOLN  COMPLICATIONS: None immediate  PROCEDURE: Informed consent was obtained from the patient following explanation of the procedure, risks, benefits and alternatives. The patient understands, agrees and consents for the procedure. All questions were addressed. A time out was performed.  Maximal barrier sterile technique utilized including caps, mask, sterile gowns, sterile gloves, large sterile drape,  hand hygiene, and Betadine.  Under sterile conditions and local anesthesia, the right nephrostomy catheter was injected confirming position in the collecting system. Catheter was cut and removed over a Bentson guidewire. Guidewire and Kumpe catheter were advanced into the ureter. Contrast injection confirms right distal ureteral obstruction. Hydroureteronephrosis evident. Obstruction crossed with a Glidewire and a 4 [place]French glide cath. Catheter was advanced into the bladder. Contrast injection confirms position in the bladder. Measurements obtained for the appropriate stent length. Amplatz guidewire inserted. An 8 French sheath was inserted to allow placement of a Bentson guidewire as a safety guidewire. Over the Amplatz guidewire, a 10 French 26 mm ureteral stent was advanced with the distal loop formed in the bladder and the proximal loop in the renal pelvis. Position confirmed with fluoroscopy. There is antegrade flow through the stent. External nephrostomy removed. No immediate complication. Patient tolerated the procedure well.  IMPRESSION: Successful insertion of a right 10 x 26 ureteral stent from an antegrade approach.  Right nephrostomy removed   Electronically Signed   By: Judie PetitM.  Shick M.D.   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   -- Right USE today -- UCx negative. On IV rocephin    LOS: 4 days   Ceniya Fowers C 05/19/2014, 8:37 AM

## 2014-05-20 LAB — BASIC METABOLIC PANEL
Anion gap: 7 (ref 5–15)
BUN: 35 mg/dL — AB (ref 6–23)
CO2: 24 mmol/L (ref 19–32)
Calcium: 8.6 mg/dL (ref 8.4–10.5)
Chloride: 109 mmol/L (ref 96–112)
Creatinine, Ser: 1.53 mg/dL — ABNORMAL HIGH (ref 0.50–1.35)
GFR calc Af Amer: 51 mL/min — ABNORMAL LOW (ref >90)
GFR, EST NON AFRICAN AMERICAN: 44 mL/min — AB (ref >90)
Glucose, Bld: 142 mg/dL — ABNORMAL HIGH (ref 70–99)
Potassium: 4.9 mmol/L (ref 3.5–5.1)
Sodium: 140 mmol/L (ref 135–145)

## 2014-05-20 LAB — GLUCOSE, CAPILLARY
GLUCOSE-CAPILLARY: 131 mg/dL — AB (ref 70–99)
Glucose-Capillary: 176 mg/dL — ABNORMAL HIGH (ref 70–99)

## 2014-05-20 MED ORDER — DOCUSATE SODIUM 100 MG PO CAPS: 100.0000 mg | ORAL_CAPSULE | Freq: Two times a day (BID) | ORAL | Status: AC

## 2014-05-20 MED ORDER — CIPROFLOXACIN HCL 500 MG PO TABS: 500.0000 mg | ORAL_TABLET | Freq: Two times a day (BID) | ORAL | Status: AC

## 2014-05-20 MED ORDER — AMLODIPINE BESYLATE 5 MG PO TABS: 5.0000 mg | ORAL_TABLET | Freq: Every day | ORAL | Status: AC

## 2014-05-20 MED ORDER — TAMSULOSIN HCL 0.4 MG PO CAPS: 0.4000 mg | ORAL_CAPSULE | Freq: Every day | ORAL | Status: AC

## 2014-05-20 MED ORDER — HYDROCODONE-ACETAMINOPHEN 5-325 MG PO TABS: 1.0000 | ORAL_TABLET | Freq: Four times a day (QID) | ORAL | Status: AC | PRN

## 2014-05-20 NOTE — Progress Notes (Signed)
Patient discharged home with patient, discharge instructions given and explained to patient and he verbalized understanding, denies any pain/distress. No wound noted, skin intact. Accompanied home by wife, transported to the car by staff via wheelchair.

## 2014-05-20 NOTE — Discharge Instructions (Signed)
1. You may see some blood in the urine and may have some burning with urination for 48-72 hours. You also may notice that you have to urinate more frequently or urgently after your procedure which is normal.  2. You should call should you develop an inability urinate, fever > 101, persistent nausea and vomiting that prevents you from eating or drinking to stay hydrated.  3. You have a stent. you will likely urinate more frequently and urgently until the stent is removed and you may experience some discomfort/pain in the lower abdomen and flank especially when urinating. You may take pain medication prescribed to you if needed for pain. You may also intermittently have blood in the urine until the stent is removed. It is essential that you follow up for stent removal as instructed. If the stent is left in place, this could result in permanent damage to and even loss of the kidney, as well as other complications like infections and kidney stones.

## 2014-05-20 NOTE — Discharge Summary (Signed)
Physician Discharge Summary  Kevin Jenkins ZOX:[id number] DOB: 08/18/1940 DOA: 05/15/2014  PCP: Josue Hector, MD  Admit date: 05/15/2014 Discharge date: 05/20/2014   Recommendations for Outpatient Follow-Up:   1. F/U with Dr. Patsi Sears in 1 week. 2. PCP, please F/U on BP control.  Norvasc started.  ACE-I stopped due to hyperkalemia/ARF. 3. Needs close F/U of DM control given elevated hemoglobin A1c not at goal. 4. Consider allopurinol.   Discharge Diagnosis:   Principal Problem:    Acute renal failure superimposed on stage 3 chronic kidney disease Active Problems:    Hyperkalemia    Diabetes    Benign essential HTN    Leukocytosis    Right ureteral stone    Metabolic acidosis   Discharge Condition: Improved.  Diet recommendation: Low sodium, heart healthy.     History of Present Illness:   Kevin Jenkins is an 74 y.o. male with a PMH of hypertension, diabetes, stage III-4 chronic kidney disease, tobacco abuse, and nephrolithiasis who was admitted 05/15/14 for surgical resection of a 13 mm ureteral stone. The perioperative area, the patient was found to have a WBC of 14.5 with acute on chronic renal failure (creatinine 8.53) and a potassium of 7.5. Surgery was canceled and the patient was referred for admission.  Hospital Course by Problem:   Principal Problem:  Acute on chronic renal failure secondary to right obstructive uropathy/right hydronephrosis/right ureterolithiasis in the setting of chronic kidney disease - Acute renal failure multifactorial with ACE inhibitor, dehydration, right hydronephrosis contributory. - Status post right nephrostomy tube placement 05/16/14 and right ureteral stent 05/18/14. - Continue IV fluids. Creatinine down to 1.53. Baseline creatinine 1.2.  Active Problems:  Hyperkalemia - Triggered by worsening renal failure and ACE inhibitor therapy. - Status post stabilization of hyperkalemia with insulin/dextrose, IV fluids  and Kayexalate.   Diabetes - Hemoglobin A1c 8.3%. - Resume Glipizide at discharge.   Benign essential HTN - ACE inhibitor on hold. Continue amlodipine.   Leukocytosis - Resolved. Likely a stress reaction. - Urine culture negative, status post IV Rocephin 05/15/14 through 05/17/14.   Right ureteral stone - Status post right antegrade ureteral stent placement 05/18/14. S/P surgical removal 05/19/14.   Metabolic acidosis - Secondary to acute renal failure, resolved.    Medical Consultants:   1. Dr. Jethro Bolus, Urology 2. Interventional Radiology   Discharge Exam:   Filed Vitals:   05/20/14 0929  BP: 140/97  Pulse:   Temp:   Resp:    Filed Vitals:   05/19/14 1835 05/19/14 2030 05/20/14 0441 05/20/14 0929  BP: 132/75 141/83 129/66 140/97  Pulse: 75 92 64   Temp: 97.6 F (36.4 C) 98.2 F (36.8 C) 97.6 F (36.4 C)   TempSrc: Oral Oral Oral   Resp: Height:      Weight:      SpO2: 100% 97% 96%     Gen:  NAD Cardiovascular:  RRR, No M/R/G Respiratory: Lungs CTAB Gastrointestinal: Abdomen soft, NT/ND with normal active bowel sounds. Extremities: No C/E/C   The results of significant diagnostics from this hospitalization (including imaging, microbiology, ancillary and laboratory) are listed below for reference.     Procedures and Diagnostic Studies:   US Renal 05/15/2014: 1. Moderate right-sided hydroureteronephrosis concerning for obstructive stone in the distal right ureter. Correlation with noncontrast CT scan could provide additional diagnostic information if clinically appropriate. 2. Other large stones are present within the right renal collecting system, measuring up to 2 cm in  the lower pole. 3. Left kidney is remarkable for diffuse cortical thinning and increased echogenicity, suggesting underlying medical renal disease. 4. Large exophytic simple cyst in the lower pole of the right kidney incidentally noted.   Ir Nephrostomy Placement  Right 05/16/2014: Successful right 10 French nephrostomy insertion   Ir Ureteral Stent Placement Existing Access Right 05/18/2014: Successful insertion of a right 10 x 26 ureteral stent from an antegrade approach. Right nephrostomy removed.   Cystoscopy with Right stent removal; Right ureteroscopy and stone removal; Ureteroscopic laser lithotripsy; Right ureteral stent placement (6Fr x 26 cm); Right retrograde pyelography with interpretation 05/20/14  Labs:   Basic Metabolic Panel:  Recent Labs Lab 05/15/14 2156  05/16/14 0830 05/17/14 0418 05/18/14 0338 05/19/14 0453 05/20/14 0531  NA 142  --  138 140 140 140 140  K 6.2*  < > 5.9* 5.2* 5.3* 5.3* 4.9  CL 109  --  105 108 111 106 109  CO2 20  --  21 22 22 22 24   GLUCOSE 152*  --  143* 122* 190* 165* 142*  BUN 118*  --  118* 81* 49* 40* 35*  CREATININE 8.24*  --  8.22* 3.84* 1.95* 1.78* 1.53*  CALCIUM 9.7  --  8.9 8.9 8.8 8.7 8.6  MG 2.6*  --   --   --   --   --   --   PHOS 8.9*  --   --   --   --   --   --   < > = values in this interval not displayed. GFR Estimated Creatinine Clearance: 56.4 mL/min (by C-G formula based on Cr of 1.53). Liver Function Tests:  Recent Labs Lab 05/15/14 2156  AST 14  ALT 15  ALKPHOS 61  BILITOT 0.4  PROT 7.6  ALBUMIN 4.0   Coagulation profile  Recent Labs Lab 05/15/14 2156  INR 1.13    CBC:  Recent Labs Lab 05/15/14 1505 05/15/14 1641 05/15/14 2156 05/16/14 0830 05/18/14 0338  WBC 14.7*  --  15.2* 12.1* 9.9  HGB 14.1 15.0 13.9 12.7* 12.6*  HCT 43.5 44.0 43.3 38.6* 39.8  MCV 90.8  --  91.2 90.2 91.3  PLT 262  --  271 229 229   CBG:  Recent Labs Lab 05/19/14 0749 05/19/14 1138 05/19/14 1731 05/19/14 2157 05/20/14 0728  GLUCAP 150* 147* 171* 176* 131*   Microbiology Recent Results (from the past 240 hour(s))  Urine culture     Status: None   Collection Time: 05/15/14 10:32 PM  Result Value Ref Range Status   Specimen Description URINE, CLEAN CATCH  Final    Special Requests NONE  Final   Colony Count NO GROWTH Performed at [facility]   Final   Culture NO GROWTH Performed at [facility]   Final   Report Status 05/17/2014 FINAL  Final  Culture, Urine     Status: None   Collection Time: 05/16/14 12:43 PM  Result Value Ref Range Status   Specimen Description URINE, CLEAN CATCH  Final   Special Requests NONE  Final   Colony Count NO GROWTH Performed at [facility]   Final   Culture NO GROWTH Performed at [facility]   Final   Report Status 05/17/2014 FINAL  Final  Surgical pcr screen     Status: None   Collection Time: 05/17/14  9:02 PM  Result Value Ref Range Status   MRSA, PCR NEGATIVE NEGATIVE Final   Staphylococcus aureus NEGATIVE NEGATIVE  Final    Comment:        The Xpert SA Assay (FDA approved for NASAL specimens in patients over 86 years of age), is one component of a comprehensive surveillance program.  Test performance has been validated by [facility] for patients greater than or equal to 38 year old. It is not intended to diagnose infection nor to guide or monitor treatment.      Discharge Instructions:   Discharge Instructions    Call MD for:  persistant nausea and vomiting    Complete by:  As directed      Call MD for:  severe uncontrolled pain    Complete by:  As directed      Call MD for:  temperature >100.4    Complete by:  As directed      Diet - low sodium heart healthy    Complete by:  As directed      Increase activity slowly    Complete by:  As directed             Medication List    TAKE these medications        amLODipine 5 MG tablet  Commonly known as:  NORVASC  Take 1 tablet (5 mg total) by mouth daily.     aspirin EC 81 MG tablet  Take 81 mg by mouth daily.     ciprofloxacin 500 MG tablet  Commonly known as:  CIPRO  Take 1 tablet (500 mg total) by mouth 2 (two) times daily.     docusate sodium 100 MG capsule  Commonly known as:  COLACE    Take 1 capsule (100 mg total) by mouth 2 (two) times daily.     glipiZIDE 10 MG 24 hr tablet  Commonly known as:  GLUCOTROL XL  Take 10 mg by mouth daily with breakfast.     HYDROcodone-acetaminophen 5-325 MG per tablet  Commonly known as:  NORCO  Take 1 tablet by mouth every 6 (six) hours as needed for moderate pain.     tamsulosin 0.4 MG Caps capsule  Commonly known as:  FLOMAX  Take 1 capsule (0.4 mg total) by mouth daily after supper.           Follow-up Information    Follow up with SIGMUND I Patsi Sears, MD In 1 week.   Specialty:  Urology   Why:  Office will call with an appt.   Contact information:   [address] [place] [address] [phone]        Time coordinating discharge: 35 minutes.  Signed:  Verdis Bassette  Pager [phone] [facility] 05/20/2014, 10:00 AM

## 2014-05-20 NOTE — Progress Notes (Signed)
Subjective: No complaints. Stone removed yesterday and stent left with no string.   Objective: Vital signs in last 24 hours: Temp:  [97.4 F (36.3 C)-98.2 F (36.8 C)] 97.6 F (36.4 C) (04/03 0441) Pulse Rate:  [60-92] 64 (04/03 0441) Resp:  [11-20] 20 (04/03 0441) BP: (129-172)/(66-99) 129/66 mmHg (04/03 0441) SpO2:  [96 %-100 %] 96 % (04/03 0441)  Intake/Output from previous day: 04/02 0701 - 04/03 0700 In: 1798.3 [P.O.:360; I.V.:1438.3] Out: 2600 [Urine:2600]  Physical Exam:  General: Alert and oriented CV: RRR Lungs: Clear Abdomen: Soft, ND Ext: NT, No erythema  Lab Results:  Recent Labs  05/18/14 0338  HGB 12.6*  HCT 39.8   BMET  Recent Labs  05/19/14 0453 05/20/14 0531  NA 140 140  K 5.3* 4.9  CL 106 109  CO2 22 24  GLUCOSE 165* 142*  BUN 40* 35*  CREATININE 1.78* 1.53*  CALCIUM 8.7 8.6     Studies/Results: Ir Ureteral Stent Placement Existing Access Right  05/18/2014   CLINICAL DATA:  Obstructing distal ureteral calculus, right hydroureteronephrosis, status post nephrostomy insertion 05/16/2014  EXAM: FLUOROSCOPIC RIGHT URETERAL STENT INSERTION  ANTEGRADE NEPHROSTOGRAM  RIGHT NEPHROSTOMY REMOVAL  Date:  4/1/20164/02/2014 12:59 pm  Radiologist:  M. Ruel Favorsrevor Shick, MD  Guidance:  Fluoroscopic  FLUOROSCOPY TIME:  4 minutes 42 seconds, 77 mGy  MEDICATIONS AND MEDICAL HISTORY: 2 mg Versed, 75 mcg fentanyl, 400 mg Cipro administered within 1 hour of the procedure  ANESTHESIA/SEDATION: 15 minutes  CONTRAST:  10mL OMNIPAQUE IOHEXOL 300 MG/ML  SOLN  COMPLICATIONS: None immediate  PROCEDURE: Informed consent was obtained from the patient following explanation of the procedure, risks, benefits and alternatives. The patient understands, agrees and consents for the procedure. All questions were addressed. A time out was performed.  Maximal barrier sterile technique utilized including caps, mask, sterile gowns, sterile gloves, large sterile drape, hand hygiene, and Betadine.   Under sterile conditions and local anesthesia, the right nephrostomy catheter was injected confirming position in the collecting system. Catheter was cut and removed over a Bentson guidewire. Guidewire and Kumpe catheter were advanced into the ureter. Contrast injection confirms right distal ureteral obstruction. Hydroureteronephrosis evident. Obstruction crossed with a Glidewire and a 4 [place]French glide cath. Catheter was advanced into the bladder. Contrast injection confirms position in the bladder. Measurements obtained for the appropriate stent length. Amplatz guidewire inserted. An 8 French sheath was inserted to allow placement of a Bentson guidewire as a safety guidewire. Over the Amplatz guidewire, a 10 French 26 mm ureteral stent was advanced with the distal loop formed in the bladder and the proximal loop in the renal pelvis. Position confirmed with fluoroscopy. There is antegrade flow through the stent. External nephrostomy removed. No immediate complication. Patient tolerated the procedure well.  IMPRESSION: Successful insertion of a right 10 x 26 ureteral stent from an antegrade approach.  Right nephrostomy removed   Electronically Signed   By: Judie PetitM.  Shick M.D.   On: 05/18/2014 13:41    Assessment/Plan: 56M with 1.3 cm obstructing distal right ureteral stone and likely a poorly functioning left kidney resulting in hyperkalemia (also secondary to K-cit for stone prevention of uric acid stones) and ARF (Cr 8.2 from baseline of 1.2 in 10/2012).   -- Ok for D/C home today from urology standpoint -- RTC for stent removal in 1 week. Our office will call with this appt.    LOS: 5 days   Kevin Jenkins C 05/20/2014, 7:52 AM

## 2014-05-21 ENCOUNTER — Encounter (HOSPITAL_COMMUNITY): Payer: Self-pay | Admitting: Urology

## 2014-05-21 LAB — GLUCOSE, CAPILLARY: GLUCOSE-CAPILLARY: 153 mg/dL — AB (ref 70–99)

## 2014-05-23 ENCOUNTER — Other Ambulatory Visit (HOSPITAL_COMMUNITY): Payer: Self-pay | Admitting: Urology

## 2014-05-23 DIAGNOSIS — N200 Calculus of kidney: Secondary | ICD-10-CM

## 2014-05-23 DIAGNOSIS — N289 Disorder of kidney and ureter, unspecified: Secondary | ICD-10-CM

## 2014-05-25 ENCOUNTER — Ambulatory Visit (HOSPITAL_COMMUNITY)
Admission: RE | Admit: 2014-05-25 | Discharge: 2014-05-25 | Disposition: A | Discharge status: Home / Self Care | Payer: Medicare Other | Source: Ambulatory Visit | Attending: Urology | Admitting: Urology

## 2014-05-25 DIAGNOSIS — N200 Calculus of kidney: Secondary | ICD-10-CM | POA: Insufficient documentation

## 2014-05-25 DIAGNOSIS — N289 Disorder of kidney and ureter, unspecified: Secondary | ICD-10-CM | POA: Insufficient documentation

## 2014-05-25 MED ORDER — FUROSEMIDE 10 MG/ML IJ SOLN
52.0000 mg | Freq: Once | INTRAMUSCULAR | Status: AC
  Administered 2014-05-25: 52 mg via INTRAVENOUS
  Filled 2014-05-25: qty 6

## 2014-05-25 MED ORDER — TECHNETIUM TC 99M MERTIATIDE
15.2000 | Freq: Once | INTRAVENOUS | Status: AC | PRN
  Administered 2014-05-25: 15 via INTRAVENOUS

## 2016-11-03 IMAGING — NM NM RENAL IMAGING FLOW W/ PHARM
2 series · 12 of 12 positions shown · non-contrast
Comparison: None.

CLINICAL DATA: Renal insufficiency. Recent stent placement in the
right kidney. Bilateral renal calculi

EXAM:
NUCLEAR MEDICINE RENAL SCAN WITH DIURETIC ADMINISTRATION
TECHNIQUE: Radionuclide angiographic and sequential renal images were obtained
after intravenous injection of radiopharmaceutical. Imaging was
continued during slow intravenous injection of Lasix approximately
15 minutes after the start of the examination.
RADIOPHARMACEUTICALS:  15.2 mCi Cc-KKm MAG3

[Series 1: renal scan · 4.14mm/px · 6 of 49 frames shown (1 of 2)]
[frame 5/49]
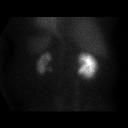
[frame 13/49]
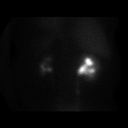
[frame 21/49]
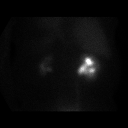
[frame 29/49]
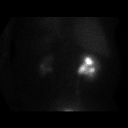
[frame 37/49]
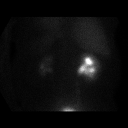
[frame 45/49]
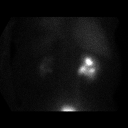

[Series 1: renal scan · 4.14mm/px · 6 of 40 frames shown (2 of 2)]
[frame 4/40  full-range]
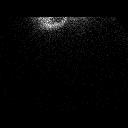
[frame 10/40  full-range]
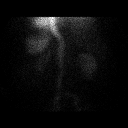
[frame 17/40  full-range]
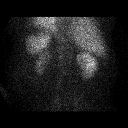
[frame 24/40  full-range]
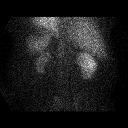
[frame 30/40  full-range]
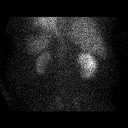
[frame 37/40  full-range]
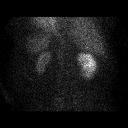

[12 of 12 positions shown; findings below may reference images not displayed]

FINDINGS: Flow: There is delayed and diminished perfusion to the left kidney.
Normal perfusion to the right kidney noted..

Left renogram: Diminished and delayed cortical uptake by the left
kidney. There is also diminished and delayed excretion of the
radiopharmaceutical by the left kidney.

Right renogram: There is normal cortical uptake by the right kidney.
Normal excretion with delayed clearance of the radiopharmaceutical
by the right kidney.

Differential:

Left kidney = 23 %

Right kidney = 77 %

T1/2 post Lasix :

Left kidney = 9.7 min

Right kidney = not achieved min
IMPRESSION: 1. There is overall diminished function of the left kidney without
evidence for obstructive uropathy.
2. Normal perfusion, cortical uptake and excretion of the
radiopharmaceutical by the right kidney with delayed clearance
consistent with partial obstruction.
3. Split renal function is equal to 23% from the left kidney and 77%
from the right kidney.

## 2021-12-03 ENCOUNTER — Ambulatory Visit: Payer: Medicare PPO | Attending: Family Medicine

## 2021-12-03 ENCOUNTER — Other Ambulatory Visit: Payer: Self-pay

## 2021-12-03 DIAGNOSIS — G8929 Other chronic pain: Secondary | ICD-10-CM | POA: Diagnosis present

## 2021-12-03 DIAGNOSIS — M25562 Pain in left knee: Secondary | ICD-10-CM | POA: Diagnosis present

## 2021-12-03 DIAGNOSIS — M6281 Muscle weakness (generalized): Secondary | ICD-10-CM | POA: Insufficient documentation

## 2021-12-03 DIAGNOSIS — M25561 Pain in right knee: Secondary | ICD-10-CM | POA: Diagnosis present

## 2021-12-03 DIAGNOSIS — R2681 Unsteadiness on feet: Secondary | ICD-10-CM | POA: Diagnosis present

## 2021-12-03 NOTE — Therapy (Signed)
OUTPATIENT PHYSICAL THERAPY LOWER EXTREMITY EVALUATION   Patient Name: Kevin Jenkins MRN: [id number] DOB:1940-06-05, 81 y.o., male Today's Date: 12/03/2021   PT End of Session - 12/03/21 1113     Visit Number 1    Number of Visits 12    Date for PT Re-Evaluation 02/13/22    PT Start Time 1115    PT Stop Time 1200    PT Time Calculation (min) 45 min    Activity Tolerance Patient tolerated treatment well    Behavior During Therapy [facility] for tasks assessed/performed             Past Medical History:  Diagnosis Date   Chronic kidney disease    Hx. of kidney stones   Diabetes mellitus without complication ([place]) [id number]   Hypertension    Past Surgical History:  Procedure Laterality Date   CYSTOSCOPY WITH RETROGRADE PYELOGRAM, URETEROSCOPY AND STENT PLACEMENT Right 05/19/2014   Procedure: CYSTOSCOPY WITH RETROGRADE PYELOGRAM, RIGHT URETEROSCOPY LASER  AND REMOVED JJ STENT, REPLACED [place];  Surgeon: Carolan Clines, MD;  Location: [facility];  Service: Urology;  Laterality: Right;   CYSTOSCOPY/RETROGRADE/URETEROSCOPY/STONE EXTRACTION WITH BASKET  2007   LITHOTRIPSY     LITHOTRIPSY     LITHOTRIPSY     Patient Active Problem List   Diagnosis Date Noted   Hyperkalemia 05/15/2014   Acute renal failure superimposed on stage 4 chronic kidney disease ([place]) 05/15/2014   Diabetes ([place]) 05/15/2014   Benign essential HTN 05/15/2014   Leukocytosis 05/15/2014   Right ureteral stone 78/67/6720   Metabolic acidosis    REFERRING PROVIDER: Lovett Calender, MD  REFERRING DIAG: Pain in right knee; Pain in left knee; Bilateral primary osteoarthritis of knee; Other symptoms and signs involving the musculoskeletal system  THERAPY DIAG:  Muscle weakness (generalized)  Unsteadiness on feet  Chronic pain of left knee  Chronic pain of right knee  Rationale for Evaluation and Treatment Rehabilitation  ONSET DATE: about 3 weeks ago  SUBJECTIVE:   SUBJECTIVE  STATEMENT: Patient reports that he fell out of his tractor about 3 weeks ago and fell on his knees. He was told that he just needs to strengthen his legs as they "look fine." He notes that he has to go up stairs with his left foot first due to weakness in his right leg. He has noticed that he will get a cramp in his right leg since the fall, but not the left leg.   PERTINENT HISTORY: HTN, DM, CKD  PAIN:  Are you having pain? Yes: NPRS scale: 5/10 Pain location: left knee Pain description: "pain"  Aggravating factors: bending his knee, moving around Relieving factors: rest  PRECAUTIONS: Fall  WEIGHT BEARING RESTRICTIONS No  FALLS:  Has patient fallen in last 6 months? Yes. Number of falls 2; the first fall was about 2 months ago when he stepped in a hole, 2nd fall was getting out of a tractor  LIVING ENVIRONMENT: Lives with: lives with their family Lives in: House/apartment Stairs: Yes: Internal: 15 steps; can reach both and External: 3-5 steps; on left going up; step to pattern with LLE leading Has following equipment at home: None  OCCUPATION: Turf grass management; primarily driving a truck or tractor  PLOF: Independent  PATIENT GOALS reduced pain and improved strength   OBJECTIVE:  COGNITION:  Overall cognitive status: Within functional limits for tasks assessed     SENSATION: Patient reports no numbness or tingling  PALPATION: TTP: left gastroc   LOWER EXTREMITY ROM:  Active ROM Right eval Left eval  Hip flexion    Hip extension    Hip abduction    Hip adduction    Hip internal rotation    Hip external rotation    Knee flexion 125; painful 135; pain at end range  Knee extension 0 0  Ankle dorsiflexion    Ankle plantarflexion    Ankle inversion    Ankle eversion     (Blank rows = not tested)  LOWER EXTREMITY MMT:  MMT Right eval Left eval  Hip flexion 4-/5 4-/5  Hip extension    Hip abduction    Hip adduction    Hip internal rotation    Hip  external rotation    Knee flexion 4/5 4/5  Knee extension 4/5 with anterior knee pain  4/5  Ankle dorsiflexion 4/5 4/5  Ankle plantarflexion    Ankle inversion    Ankle eversion     (Blank rows = not tested)  FUNCTIONAL TESTS:  5 times sit to stand: unable to perform without significant upper extremity support Timed up and go (TUG): 20.78 seconds  GAIT: Assistive device utilized: None Level of assistance: Complete Independence Comments: decreased gait speed, wearing knee brace bilaterally   BALANCE:  Romberg: 30 seconds (EO) and 5 seconds (EC)   Tandem: 7 seconds with LLE leading and 13 seconds with RLE leading   TODAY'S TREATMENT:                                   10/18 EXERCISE LOG  Exercise Repetitions and Resistance Comments  SLR  15 reps   Bridge  15 reps   LAQ Green t-band x 10 reps each   Seated hip ABD  Green t-band x 10 reps each   Seated marching  Green t-band x 10 reps each     Blank cell = exercise not performed today    PATIENT EDUCATION:  Education details: HEP, healing, POC, prognosis Person educated: Patient Education method: Explanation Education comprehension: verbalized understanding   HOME EXERCISE PROGRAM: [id number]  ASSESSMENT:  CLINICAL IMPRESSION: Patient is a 81 y.o. male who was seen today for physical therapy evaluation and treatment for bilateral knee pain with lower extremity weakness. He presented with low pain severity and irritability with end range knee flexion being able to reproduce his familiar symptoms. He is at an elevated fall risk as evidenced by his history of falling, balance assessment, TUG time, and his lower extremity strength assessment. He was provided a HEP which he was able to properly demonstrate. He reported feeling comfortable with these interventions. Recommend that he continue with skilled physical therapy to address his remaining impairments to return to his prior level of function.    OBJECTIVE IMPAIRMENTS  Abnormal gait, decreased balance, decreased mobility, difficulty walking, decreased strength, and pain.   ACTIVITY LIMITATIONS stairs, transfers, and locomotion level  PARTICIPATION LIMITATIONS: driving, community activity, occupation, and yard work  PERSONAL FACTORS Fitness, Time since onset of injury/illness/exacerbation, and 3+ comorbidities: HTN, DM, CKD  are also affecting patient's functional outcome.   REHAB POTENTIAL: Good  CLINICAL DECISION MAKING: Stable/uncomplicated  EVALUATION COMPLEXITY: Low   GOALS: Goals reviewed with patient? No  SHORT TERM GOALS: Target date: 12/24/2021  Patient will be independent with his initial HEP.  Baseline: Goal status: INITIAL  2.  Patient will be able to transfer from sitting to standing with minimal upper extremity support.  Baseline:  Goal  status: INITIAL  3.  Patient will improve his TUG time to 15 seconds or less for improved functional mobility.  Baseline:  Goal status: INITIAL  LONG TERM GOALS: Target date: 01/14/2022   Patient will be independent with his advanced HEP.  Baseline:  Goal status: INITIAL  2.  Patient will improve his TUG time to 12 seconds or less for improved safety.  Baseline:  Goal status: INITIAL  3.  Patient will be able to complete his daily activities without his familiar knee pain exceeding 2/10.  Baseline:  Goal status: INITIAL  PLAN: PT FREQUENCY: 2x/week  PT DURATION: 6 weeks  PLANNED INTERVENTIONS: Therapeutic exercises, Therapeutic activity, Neuromuscular re-education, Balance training, Gait training, Patient/Family education, Self Care, Joint mobilization, Stair training, Electrical stimulation, Cryotherapy, Moist heat, Taping, Vasopneumatic device, Manual therapy, and Re-evaluation  PLAN FOR NEXT SESSION: nustep, lower extremity strengthening and modalities as needed   Granville Lewis, PT 12/03/2021, 12:44 PM

## 2021-12-09 ENCOUNTER — Ambulatory Visit: Payer: Medicare PPO

## 2021-12-09 DIAGNOSIS — R2681 Unsteadiness on feet: Secondary | ICD-10-CM

## 2021-12-09 DIAGNOSIS — M6281 Muscle weakness (generalized): Secondary | ICD-10-CM

## 2021-12-09 DIAGNOSIS — G8929 Other chronic pain: Secondary | ICD-10-CM

## 2021-12-09 NOTE — Therapy (Signed)
OUTPATIENT PHYSICAL THERAPY LOWER EXTREMITY TREATMENT   Patient Name: Kevin Jenkins MRN: [id number] DOB:12-18-1940, 81 y.o., male Today's Date: 12/09/2021   PT End of Session - 12/09/21 0859     Visit Number 2    Number of Visits 12    Date for PT Re-Evaluation 02/13/22    PT Start Time 0900    PT Stop Time 0944    PT Time Calculation (min) 44 min    Activity Tolerance Patient tolerated treatment well    Behavior During Therapy [facility] for tasks assessed/performed              Past Medical History:  Diagnosis Date   Chronic kidney disease    Hx. of kidney stones   Diabetes mellitus without complication (HCC) 2006ish   Hypertension    Past Surgical History:  Procedure Laterality Date   CYSTOSCOPY WITH RETROGRADE PYELOGRAM, URETEROSCOPY AND STENT PLACEMENT Right 05/19/2014   Procedure: CYSTOSCOPY WITH RETROGRADE PYELOGRAM, RIGHT URETEROSCOPY LASER  AND REMOVED JJ STENT, REPLACED JJ STENT  PLACEMENT;  Surgeon: Jethro Bolus, MD;  Location: [facility];  Service: Urology;  Laterality: Right;   CYSTOSCOPY/RETROGRADE/URETEROSCOPY/STONE EXTRACTION WITH BASKET  2007   LITHOTRIPSY     LITHOTRIPSY     LITHOTRIPSY     Patient Active Problem List   Diagnosis Date Noted   Hyperkalemia 05/15/2014   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) 05/15/2014   Diabetes (HCC) 05/15/2014   Benign essential HTN 05/15/2014   Leukocytosis 05/15/2014   Right ureteral stone 05/15/2014   Metabolic acidosis    REFERRING PROVIDER: Vernell Leep, MD  REFERRING DIAG: Pain in right knee; Pain in left knee; Bilateral primary osteoarthritis of knee; Other symptoms and signs involving the musculoskeletal system  THERAPY DIAG:  Muscle weakness (generalized)  Unsteadiness on feet  Chronic pain of left knee  Chronic pain of right knee  Rationale for Evaluation and Treatment Rehabilitation  ONSET DATE: about 3 weeks ago  SUBJECTIVE:   SUBJECTIVE  STATEMENT: Patient reports that he feels like his legs are getting better. However, his legs still feel weak.   PERTINENT HISTORY: HTN, DM, CKD  PAIN:  Are you having pain? Yes: NPRS scale: 5/10 Pain location: left knee Pain description: "pain"  Aggravating factors: bending his knee, moving around Relieving factors: rest  PRECAUTIONS: Fall  WEIGHT BEARING RESTRICTIONS No  FALLS:  Has patient fallen in last 6 months? Yes. Number of falls 2; the first fall was about 2 months ago when he stepped in a hole, 2nd fall was getting out of a tractor  LIVING ENVIRONMENT: Lives with: lives with their family Lives in: House/apartment Stairs: Yes: Internal: 15 steps; can reach both and External: 3-5 steps; on left going up; step to pattern with LLE leading Has following equipment at home: None  OCCUPATION: Turf grass management; primarily driving a truck or tractor  PLOF: Independent  PATIENT GOALS reduced pain and improved strength   OBJECTIVE: all objective assessments were performed at his initial evaluation on 12/03/21 unless otherwise noted COGNITION:  Overall cognitive status: Within functional limits for tasks assessed     SENSATION: Patient reports no numbness or tingling  PALPATION: TTP: left gastroc   LOWER EXTREMITY ROM:  Active ROM Right eval Left eval  Hip flexion    Hip extension    Hip abduction    Hip adduction    Hip internal rotation    Hip external rotation    Knee flexion 125; painful  135; pain at end range  Knee extension 0 0  Ankle dorsiflexion    Ankle plantarflexion    Ankle inversion    Ankle eversion     (Blank rows = not tested)  LOWER EXTREMITY MMT:  MMT Right eval Left eval  Hip flexion 4-/5 4-/5  Hip extension    Hip abduction    Hip adduction    Hip internal rotation    Hip external rotation    Knee flexion 4/5 4/5  Knee extension 4/5 with anterior knee pain  4/5  Ankle dorsiflexion 4/5 4/5  Ankle plantarflexion    Ankle  inversion    Ankle eversion     (Blank rows = not tested)  FUNCTIONAL TESTS:  5 times sit to stand: unable to perform without significant upper extremity support Timed up and go (TUG): 20.78 seconds  GAIT: Assistive device utilized: None Level of assistance: Complete Independence Comments: decreased gait speed, wearing knee brace bilaterally   BALANCE:  Romberg: 30 seconds (EO) and 5 seconds (EC)   Tandem: 7 seconds with LLE leading and 13 seconds with RLE leading   TODAY'S TREATMENT:                                   10/24 EXERCISE LOG  Exercise Repetitions and Resistance Comments  Nustep  L5 x 11 minutes   Cybex knee extension 10# x 20 reps   Cybex knee flexion  50# x 30 reps   Lateral step up and over 6" step x 2 minutes   Rocker board 2.5 minutes   Marching on foam 2 minutes BUE support  Seated clams Green t-band x 2 minutes   Gastroc stretch  4 x 30 seconds   Standing hip ABD  20 reps each    Lunges onto step  14" step x 25 reps each     Blank cell = exercise not performed today                                    10/18 EXERCISE LOG  Exercise Repetitions and Resistance Comments  SLR  15 reps   Bridge  15 reps   LAQ Green t-band x 10 reps each   Seated hip ABD  Green t-band x 10 reps each   Seated marching  Green t-band x 10 reps each     Blank cell = exercise not performed today    PATIENT EDUCATION:  Education details: HEP, healing, POC, prognosis Person educated: Patient Education method: Explanation Education comprehension: verbalized understanding   HOME EXERCISE PROGRAM: [id number]  ASSESSMENT:  CLINICAL IMPRESSION: Patient was introduced to multiple new interventions for improved lower extremity strength with moderate difficulty. He required minimal cueing with today's interventions proper pacing as he attempted to rush through today's interventions. He experienced a mild increase in right knee discomfort with resisted knee extension, but this did not  limit his ability to complete any of today's interventions. He reported feeling alright upon the conclusion of treatment. Recommend that he continue with skilled physical therapy to address his remaining impairments to maximize his safety and functional mobility.   OBJECTIVE IMPAIRMENTS Abnormal gait, decreased balance, decreased mobility, difficulty walking, decreased strength, and pain.   ACTIVITY LIMITATIONS stairs, transfers, and locomotion level  PARTICIPATION LIMITATIONS: driving, community activity, occupation, and yard work  PERSONAL FACTORS Fitness, Time  since onset of injury/illness/exacerbation, and 3+ comorbidities: HTN, DM, CKD  are also affecting patient's functional outcome.   REHAB POTENTIAL: Good  CLINICAL DECISION MAKING: Stable/uncomplicated  EVALUATION COMPLEXITY: Low   GOALS: Goals reviewed with patient? No  SHORT TERM GOALS: Target date: 12/24/2021  Patient will be independent with his initial HEP.  Baseline: Goal status: INITIAL  2.  Patient will be able to transfer from sitting to standing with minimal upper extremity support.  Baseline:  Goal status: INITIAL  3.  Patient will improve his TUG time to 15 seconds or less for improved functional mobility.  Baseline:  Goal status: INITIAL  LONG TERM GOALS: Target date: 01/14/2022   Patient will be independent with his advanced HEP.  Baseline:  Goal status: INITIAL  2.  Patient will improve his TUG time to 12 seconds or less for improved safety.  Baseline:  Goal status: INITIAL  3.  Patient will be able to complete his daily activities without his familiar knee pain exceeding 2/10.  Baseline:  Goal status: INITIAL  PLAN: PT FREQUENCY: 2x/week  PT DURATION: 6 weeks  PLANNED INTERVENTIONS: Therapeutic exercises, Therapeutic activity, Neuromuscular re-education, Balance training, Gait training, Patient/Family education, Self Care, Joint mobilization, Stair training, Electrical stimulation,  Cryotherapy, Moist heat, Taping, Vasopneumatic device, Manual therapy, and Re-evaluation  PLAN FOR NEXT SESSION: nustep, lower extremity strengthening and modalities as needed   Granville Lewis, PT 12/09/2021, 9:57 AM

## 2021-12-11 ENCOUNTER — Ambulatory Visit: Payer: Medicare PPO

## 2021-12-11 DIAGNOSIS — M6281 Muscle weakness (generalized): Secondary | ICD-10-CM | POA: Diagnosis not present

## 2021-12-11 DIAGNOSIS — R2681 Unsteadiness on feet: Secondary | ICD-10-CM

## 2021-12-11 DIAGNOSIS — G8929 Other chronic pain: Secondary | ICD-10-CM

## 2021-12-11 NOTE — Therapy (Signed)
OUTPATIENT PHYSICAL THERAPY LOWER EXTREMITY TREATMENT   Patient Name: Kevin Jenkins MRN: [id number] DOB:1940-06-10, 81 y.o., male Today's Date: 12/11/2021   PT End of Session - 12/11/21 0910     Visit Number 3    Number of Visits 12    Date for PT Re-Evaluation 02/13/22    PT Start Time 0900    PT Stop Time [phone]   Patient had to leave early for another appointment.   PT Time Calculation (min) 38 min    Activity Tolerance Patient tolerated treatment well    Behavior During Therapy WFL for tasks assessed/performed               Past Medical History:  Diagnosis Date   Chronic kidney disease    Hx. of kidney stones   Diabetes mellitus without complication (HCC) 2006ish   Hypertension    Past Surgical History:  Procedure Laterality Date   CYSTOSCOPY WITH RETROGRADE PYELOGRAM, URETEROSCOPY AND STENT PLACEMENT Right 05/19/2014   Procedure: CYSTOSCOPY WITH RETROGRADE PYELOGRAM, RIGHT URETEROSCOPY LASER  AND REMOVED JJ STENT, REPLACED JJ STENT  PLACEMENT;  Surgeon: Jethro Bolus, MD;  Location: [facility];  Service: Urology;  Laterality: Right;   CYSTOSCOPY/RETROGRADE/URETEROSCOPY/STONE EXTRACTION WITH BASKET  2007   LITHOTRIPSY     LITHOTRIPSY     LITHOTRIPSY     Patient Active Problem List   Diagnosis Date Noted   Hyperkalemia 05/15/2014   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) 05/15/2014   Diabetes (HCC) 05/15/2014   Benign essential HTN 05/15/2014   Leukocytosis 05/15/2014   Right ureteral stone 05/15/2014   Metabolic acidosis    REFERRING PROVIDER: Vernell Leep, MD  REFERRING DIAG: Pain in right knee; Pain in left knee; Bilateral primary osteoarthritis of knee; Other symptoms and signs involving the musculoskeletal system  THERAPY DIAG:  Muscle weakness (generalized)  Unsteadiness on feet  Chronic pain of left knee  Chronic pain of right knee  Rationale for Evaluation and Treatment Rehabilitation  ONSET DATE:  about 3 weeks ago  SUBJECTIVE:   SUBJECTIVE STATEMENT: Patient reports that his knees were sore yesterday   PERTINENT HISTORY: HTN, DM, CKD  PAIN:  Are you having pain? Yes: NPRS scale: 5/10 Pain location: left knee Pain description: "pain"  Aggravating factors: bending his knee, moving around Relieving factors: rest  PRECAUTIONS: Fall  WEIGHT BEARING RESTRICTIONS No  FALLS:  Has patient fallen in last 6 months? Yes. Number of falls 2; the first fall was about 2 months ago when he stepped in a hole, 2nd fall was getting out of a tractor  LIVING ENVIRONMENT: Lives with: lives with their family Lives in: House/apartment Stairs: Yes: Internal: 15 steps; can reach both and External: 3-5 steps; on left going up; step to pattern with LLE leading Has following equipment at home: None  OCCUPATION: Turf grass management; primarily driving a truck or tractor  PLOF: Independent  PATIENT GOALS reduced pain and improved strength   OBJECTIVE: all objective assessments were performed at his initial evaluation on 12/03/21 unless otherwise noted COGNITION:  Overall cognitive status: Within functional limits for tasks assessed     SENSATION: Patient reports no numbness or tingling  PALPATION: TTP: left gastroc   LOWER EXTREMITY ROM:  Active ROM Right eval Left eval  Hip flexion    Hip extension    Hip abduction    Hip adduction    Hip internal rotation    Hip external rotation    Knee flexion 125;  painful 135; pain at end range  Knee extension 0 0  Ankle dorsiflexion    Ankle plantarflexion    Ankle inversion    Ankle eversion     (Blank rows = not tested)  LOWER EXTREMITY MMT:  MMT Right eval Left eval  Hip flexion 4-/5 4-/5  Hip extension    Hip abduction    Hip adduction    Hip internal rotation    Hip external rotation    Knee flexion 4/5 4/5  Knee extension 4/5 with anterior knee pain  4/5  Ankle dorsiflexion 4/5 4/5  Ankle plantarflexion    Ankle  inversion    Ankle eversion     (Blank rows = not tested)  FUNCTIONAL TESTS:  5 times sit to stand: unable to perform without significant upper extremity support Timed up and go (TUG): 20.78 seconds  GAIT: Assistive device utilized: None Level of assistance: Complete Independence Comments: decreased gait speed, wearing knee brace bilaterally   BALANCE:  Romberg: 30 seconds (EO) and 5 seconds (EC)   Tandem: 7 seconds with LLE leading and 13 seconds with RLE leading   TODAY'S TREATMENT:                                   10/26 EXERCISE LOG  Exercise Repetitions and Resistance Comments  Nustep  L5 x 13 minutes    Step up  8" step x 20 reps    Cybex knee flexion 40# x 2 minutes   Cybex knee extension 10# x 2 minutes    Standing hip ABD  Green t-band x 2 minutes    Rocker board 5 minutes   Tandem stance  5 x 30 seconds each     Blank cell = exercise not performed today                                    10/24 EXERCISE LOG  Exercise Repetitions and Resistance Comments  Nustep  L5 x 11 minutes   Cybex knee extension 10# x 20 reps   Cybex knee flexion  50# x 30 reps   Lateral step up and over 6" step x 2 minutes   Rocker board 2.5 minutes   Marching on foam 2 minutes BUE support  Seated clams Green t-band x 2 minutes   Gastroc stretch  4 x 30 seconds   Standing hip ABD  20 reps each    Lunges onto step  14" step x 25 reps each     Blank cell = exercise not performed today                                    10/18 EXERCISE LOG  Exercise Repetitions and Resistance Comments  SLR  15 reps   Bridge  15 reps   LAQ Green t-band x 10 reps each   Seated hip ABD  Green t-band x 10 reps each   Seated marching  Green t-band x 10 reps each     Blank cell = exercise not performed today    PATIENT EDUCATION:  Education details: HEP, healing, POC, prognosis Person educated: Patient Education method: Explanation Education comprehension: verbalized understanding   HOME EXERCISE  PROGRAM: [email or web address]  ASSESSMENT:  CLINICAL IMPRESSION: Patient was introduced to step  ups and resisted hip abduction with moderate difficulty. He required minimal cueing with resisted hip abduction for improved eccentric hip control for improved hip strengthening. He experienced no pain or discomfort with any of today's interventions. He reported feeling alright upon the conclusion of treatment. He continues to require skilled physical therapy to address his remaining impairments to return to his prior level of function.   OBJECTIVE IMPAIRMENTS Abnormal gait, decreased balance, decreased mobility, difficulty walking, decreased strength, and pain.   ACTIVITY LIMITATIONS stairs, transfers, and locomotion level  PARTICIPATION LIMITATIONS: driving, community activity, occupation, and yard work  PERSONAL FACTORS Fitness, Time since onset of injury/illness/exacerbation, and 3+ comorbidities: HTN, DM, CKD  are also affecting patient's functional outcome.   REHAB POTENTIAL: Good  CLINICAL DECISION MAKING: Stable/uncomplicated  EVALUATION COMPLEXITY: Low   GOALS: Goals reviewed with patient? No  SHORT TERM GOALS: Target date: 12/24/2021  Patient will be independent with his initial HEP.  Baseline: Goal status: INITIAL  2.  Patient will be able to transfer from sitting to standing with minimal upper extremity support.  Baseline:  Goal status: INITIAL  3.  Patient will improve his TUG time to 15 seconds or less for improved functional mobility.  Baseline:  Goal status: INITIAL  LONG TERM GOALS: Target date: 01/14/2022   Patient will be independent with his advanced HEP.  Baseline:  Goal status: INITIAL  2.  Patient will improve his TUG time to 12 seconds or less for improved safety.  Baseline:  Goal status: INITIAL  3.  Patient will be able to complete his daily activities without his familiar knee pain exceeding 2/10.  Baseline:  Goal status: INITIAL  PLAN: PT FREQUENCY:  2x/week  PT DURATION: 6 weeks  PLANNED INTERVENTIONS: Therapeutic exercises, Therapeutic activity, Neuromuscular re-education, Balance training, Gait training, Patient/Family education, Self Care, Joint mobilization, Stair training, Electrical stimulation, Cryotherapy, Moist heat, Taping, Vasopneumatic device, Manual therapy, and Re-evaluation  PLAN FOR NEXT SESSION: nustep, lower extremity strengthening and modalities as needed   Granville Lewis, PT 12/11/2021, 9:52 AM

## 2021-12-16 ENCOUNTER — Ambulatory Visit: Payer: Medicare PPO | Admitting: *Deleted

## 2021-12-16 ENCOUNTER — Encounter: Payer: Self-pay | Admitting: *Deleted

## 2021-12-16 DIAGNOSIS — M6281 Muscle weakness (generalized): Secondary | ICD-10-CM

## 2021-12-16 DIAGNOSIS — G8929 Other chronic pain: Secondary | ICD-10-CM

## 2021-12-16 DIAGNOSIS — R2681 Unsteadiness on feet: Secondary | ICD-10-CM

## 2021-12-16 NOTE — Therapy (Signed)
OUTPATIENT PHYSICAL THERAPY LOWER EXTREMITY TREATMENT   Patient Name: Kevin Jenkins MRN: [id number] DOB:04/03/1940, 81 y.o., male Today's Date: 12/16/2021   PT End of Session - 12/16/21 0823     Visit Number 4    Number of Visits 12    Date for PT Re-Evaluation 02/13/22    PT Start Time 0815               Past Medical History:  Diagnosis Date   Chronic kidney disease    Hx. of kidney stones   Diabetes mellitus without complication ([place]) [id number]   Hypertension    Past Surgical History:  Procedure Laterality Date   CYSTOSCOPY WITH RETROGRADE PYELOGRAM, URETEROSCOPY AND STENT PLACEMENT Right 05/19/2014   Procedure: CYSTOSCOPY WITH RETROGRADE PYELOGRAM, RIGHT URETEROSCOPY LASER  AND REMOVED JJ STENT, REPLACED [place];  Surgeon: Carolan Clines, MD;  Location: [facility];  Service: Urology;  Laterality: Right;   CYSTOSCOPY/RETROGRADE/URETEROSCOPY/STONE EXTRACTION WITH BASKET  2007   LITHOTRIPSY     LITHOTRIPSY     LITHOTRIPSY     Patient Active Problem List   Diagnosis Date Noted   Hyperkalemia 05/15/2014   Acute renal failure superimposed on stage 4 chronic kidney disease ([place]) 05/15/2014   Diabetes ([place]) 05/15/2014   Benign essential HTN 05/15/2014   Leukocytosis 05/15/2014   Right ureteral stone 09/32/3557   Metabolic acidosis    REFERRING PROVIDER: Lovett Calender, MD  REFERRING DIAG: Pain in right knee; Pain in left knee; Bilateral primary osteoarthritis of knee; Other symptoms and signs involving the musculoskeletal system  THERAPY DIAG:  Muscle weakness (generalized)  Unsteadiness on feet  Chronic pain of left knee  Chronic pain of right knee  Rationale for Evaluation and Treatment Rehabilitation  ONSET DATE: about 3 weeks ago  SUBJECTIVE:   SUBJECTIVE STATEMENT: Patient reports that his knees were sore, but doing better.  PERTINENT HISTORY: HTN, DM, CKD  PAIN:  Are you having pain? Yes: NPRS scale: 2-3/10 Pain location: left  knee Pain description: "pain"  Aggravating factors: bending his knee, moving around Relieving factors: rest  PRECAUTIONS: Fall  WEIGHT BEARING RESTRICTIONS No  FALLS:  Has patient fallen in last 6 months? Yes. Number of falls 2; the first fall was about 2 months ago when he stepped in a hole, 2nd fall was getting out of a tractor  LIVING ENVIRONMENT: Lives with: lives with their family Lives in: House/apartment Stairs: Yes: Internal: 15 steps; can reach both and External: 3-5 steps; on left going up; step to pattern with LLE leading Has following equipment at home: None  OCCUPATION: Turf grass management; primarily driving a truck or tractor  PLOF: Independent  PATIENT GOALS reduced pain and improved strength   OBJECTIVE: all objective assessments were performed at his initial evaluation on 12/03/21 unless otherwise noted COGNITION:  Overall cognitive status: Within functional limits for tasks assessed     SENSATION: Patient reports no numbness or tingling  PALPATION: TTP: left gastroc   LOWER EXTREMITY ROM:  Active ROM Right eval Left eval  Hip flexion    Hip extension    Hip abduction    Hip adduction    Hip internal rotation    Hip external rotation    Knee flexion 125; painful 135; pain at end range  Knee extension 0 0  Ankle dorsiflexion    Ankle plantarflexion    Ankle inversion    Ankle eversion     (Blank rows = not tested)  LOWER EXTREMITY MMT:  MMT Right eval Left eval  Hip flexion 4-/5 4-/5  Hip extension    Hip abduction    Hip adduction    Hip internal rotation    Hip external rotation    Knee flexion 4/5 4/5  Knee extension 4/5 with anterior knee pain  4/5  Ankle dorsiflexion 4/5 4/5  Ankle plantarflexion    Ankle inversion    Ankle eversion     (Blank rows = not tested)  FUNCTIONAL TESTS:  5 times sit to stand: unable to perform without significant upper extremity support Timed up and go (TUG): 20.78 seconds  GAIT: Assistive  device utilized: None Level of assistance: Complete Independence Comments: decreased gait speed, wearing knee brace bilaterally   BALANCE:  Romberg: 30 seconds (EO) and 5 seconds (EC)   Tandem: 7 seconds with LLE leading and 13 seconds with RLE leading   TODAY'S TREATMENT:                                   10/31           EXERCISE LOG    Exercise Repetitions and Resistance Comments  Nustep  L4 x 15 minutes    Step up  8" step x 20 reps    Cybex knee flexion 40# x 3 minutes   Cybex knee extension 10# x 3 minutes    Standing hip ABD  Green t-band x 2 minutes    Rocker board 3 minutes   Tandem stance     Heel-ups/Toe ups X15 each   14in box lunge X15 each side with focus on quad control  RT knee pain -scale back to no pain  Step downs on wedge LT 2x10, RT x 10  Painful on RT  Sit to stand X6 on mat table with focus on glutes    Blank cell = exercise not performed today                                    10/24 EXERCISE LOG  Exercise Repetitions and Resistance Comments  Nustep  L5 x 11 minutes   Cybex knee extension 10# x 20 reps   Cybex knee flexion  50# x 30 reps   Lateral step up and over 6" step x 2 minutes   Rocker board 2.5 minutes   Marching on foam 2 minutes BUE support  Seated clams Green t-band x 2 minutes   Gastroc stretch  4 x 30 seconds   Standing hip ABD  20 reps each    Lunges onto step  14" step x 25 reps each     Blank cell = exercise not performed today                                    10/18 EXERCISE LOG  Exercise Repetitions and Resistance Comments  SLR  15 reps   Bridge  15 reps   LAQ Green t-band x 10 reps each   Seated hip ABD  Green t-band x 10 reps each   Seated marching  Green t-band x 10 reps each     Blank cell = exercise not performed today    PATIENT EDUCATION:  Education details: HEP, healing, POC, prognosis Person educated: Patient Education method: Explanation Education comprehension: verbalized  understanding   HOME EXERCISE  PROGRAM: [id number]  ASSESSMENT:  CLINICAL IMPRESSION: Patient arrived today doing fairly well with mainly knee soreness, but reports weakness still. Rx focused on [place] and [facility] exs for strengthening both LEs. Pt did well with exs, but did have some pain in RT knee and had to scale pressure back with UE's to decrease pain ( pain free).   He continues to require skilled physical therapy to address his remaining impairments to return to his prior level of function.   OBJECTIVE IMPAIRMENTS Abnormal gait, decreased balance, decreased mobility, difficulty walking, decreased strength, and pain.   ACTIVITY LIMITATIONS stairs, transfers, and locomotion level  PARTICIPATION LIMITATIONS: driving, community activity, occupation, and yard work  PERSONAL FACTORS Fitness, Time since onset of injury/illness/exacerbation, and 3+ comorbidities: HTN, DM, CKD  are also affecting patient's functional outcome.   REHAB POTENTIAL: Good  CLINICAL DECISION MAKING: Stable/uncomplicated  EVALUATION COMPLEXITY: Low   GOALS: Goals reviewed with patient? No  SHORT TERM GOALS: Target date: 12/24/2021  Patient will be independent with his initial HEP.  Baseline: Goal status: INITIAL  2.  Patient will be able to transfer from sitting to standing with minimal upper extremity support.  Baseline:  Goal status: INITIAL  3.  Patient will improve his TUG time to 15 seconds or less for improved functional mobility.  Baseline:  Goal status: INITIAL  LONG TERM GOALS: Target date: 01/14/2022   Patient will be independent with his advanced HEP.  Baseline:  Goal status: INITIAL  2.  Patient will improve his TUG time to 12 seconds or less for improved safety.  Baseline:  Goal status: INITIAL  3.  Patient will be able to complete his daily activities without his familiar knee pain exceeding 2/10.  Baseline:  Goal status: INITIAL  PLAN: PT FREQUENCY: 2x/week  PT DURATION: 6 weeks  PLANNED INTERVENTIONS:  Therapeutic exercises, Therapeutic activity, Neuromuscular re-education, Balance training, Gait training, Patient/Family education, Self Care, Joint mobilization, Stair training, Electrical stimulation, Cryotherapy, Moist heat, Taping, Vasopneumatic device, Manual therapy, and Re-evaluation  PLAN FOR NEXT SESSION: nustep, lower extremity strengthening and modalities as needed   Selmer Adduci,CHRIS, PTA 12/16/2021, 9:08 AM

## 2021-12-18 ENCOUNTER — Ambulatory Visit: Payer: Medicare PPO | Attending: Family Medicine

## 2021-12-18 DIAGNOSIS — M25561 Pain in right knee: Secondary | ICD-10-CM | POA: Insufficient documentation

## 2021-12-18 DIAGNOSIS — G8929 Other chronic pain: Secondary | ICD-10-CM | POA: Diagnosis present

## 2021-12-18 DIAGNOSIS — M25562 Pain in left knee: Secondary | ICD-10-CM | POA: Insufficient documentation

## 2021-12-18 DIAGNOSIS — M6281 Muscle weakness (generalized): Secondary | ICD-10-CM | POA: Diagnosis present

## 2021-12-18 DIAGNOSIS — R2681 Unsteadiness on feet: Secondary | ICD-10-CM | POA: Insufficient documentation

## 2021-12-18 NOTE — Therapy (Signed)
OUTPATIENT PHYSICAL THERAPY LOWER EXTREMITY TREATMENT   Patient Name: Kevin Jenkins MRN: [id number] DOB:11-21-1940, 81 y.o., male Today's Date: 12/18/2021   PT End of Session - 12/18/21 0819     Visit Number 5    Number of Visits 12    Date for PT Re-Evaluation 02/13/22    PT Start Time 0815    PT Stop Time 0857    PT Time Calculation (min) 42 min               Past Medical History:  Diagnosis Date   Chronic kidney disease    Hx. of kidney stones   Diabetes mellitus without complication (HCC) 2006ish   Hypertension    Past Surgical History:  Procedure Laterality Date   CYSTOSCOPY WITH RETROGRADE PYELOGRAM, URETEROSCOPY AND STENT PLACEMENT Right 05/19/2014   Procedure: CYSTOSCOPY WITH RETROGRADE PYELOGRAM, RIGHT URETEROSCOPY LASER  AND REMOVED JJ STENT, REPLACED JJ STENT  PLACEMENT;  Surgeon: Jethro Bolus, MD;  Location: [facility];  Service: Urology;  Laterality: Right;   CYSTOSCOPY/RETROGRADE/URETEROSCOPY/STONE EXTRACTION WITH BASKET  2007   LITHOTRIPSY     LITHOTRIPSY     LITHOTRIPSY     Patient Active Problem List   Diagnosis Date Noted   Hyperkalemia 05/15/2014   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) 05/15/2014   Diabetes (HCC) 05/15/2014   Benign essential HTN 05/15/2014   Leukocytosis 05/15/2014   Right ureteral stone 05/15/2014   Metabolic acidosis    REFERRING PROVIDER: Vernell Leep, MD  REFERRING DIAG: Pain in right knee; Pain in left knee; Bilateral primary osteoarthritis of knee; Other symptoms and signs involving the musculoskeletal system  THERAPY DIAG:  Muscle weakness (generalized)  Unsteadiness on feet  Chronic pain of left knee  Chronic pain of right knee  Rationale for Evaluation and Treatment Rehabilitation  ONSET DATE: about 3 weeks ago  SUBJECTIVE:   SUBJECTIVE STATEMENT: Pt reports falling after Tuesday's treatment session. Pt is wearing bil knee braces today and brought his walker to  use after treatment session to avoid other falls.  Denies any pain.  PERTINENT HISTORY: HTN, DM, CKD  PAIN:  Are you having pain? No  PRECAUTIONS: Fall  WEIGHT BEARING RESTRICTIONS No  FALLS:  Has patient fallen in last 6 months? Yes. Number of falls 2; the first fall was about 2 months ago when he stepped in a hole, 2nd fall was getting out of a tractor  LIVING ENVIRONMENT: Lives with: lives with their family Lives in: House/apartment Stairs: Yes: Internal: 15 steps; can reach both and External: 3-5 steps; on left going up; step to pattern with LLE leading Has following equipment at home: None  OCCUPATION: Turf grass management; primarily driving a truck or tractor  PLOF: Independent  PATIENT GOALS reduced pain and improved strength   OBJECTIVE: all objective assessments were performed at his initial evaluation on 12/03/21 unless otherwise noted COGNITION:  Overall cognitive status: Within functional limits for tasks assessed     SENSATION: Patient reports no numbness or tingling  PALPATION: TTP: left gastroc   LOWER EXTREMITY ROM:  Active ROM Right eval Left eval  Hip flexion    Hip extension    Hip abduction    Hip adduction    Hip internal rotation    Hip external rotation    Knee flexion 125; painful 135; pain at end range  Knee extension 0 0  Ankle dorsiflexion    Ankle plantarflexion    Ankle inversion  Ankle eversion     (Blank rows = not tested)  LOWER EXTREMITY MMT:  MMT Right eval Left eval  Hip flexion 4-/5 4-/5  Hip extension    Hip abduction    Hip adduction    Hip internal rotation    Hip external rotation    Knee flexion 4/5 4/5  Knee extension 4/5 with anterior knee pain  4/5  Ankle dorsiflexion 4/5 4/5  Ankle plantarflexion    Ankle inversion    Ankle eversion     (Blank rows = not tested)  FUNCTIONAL TESTS:  5 times sit to stand: unable to perform without significant upper extremity support Timed up and go (TUG): 20.78  seconds  GAIT: Assistive device utilized: None Level of assistance: Complete Independence Comments: decreased gait speed, wearing knee brace bilaterally   BALANCE:  Romberg: 30 seconds (EO) and 5 seconds (EC)   Tandem: 7 seconds with LLE leading and 13 seconds with RLE leading   TODAY'S TREATMENT:                                   11/2       EXERCISE LOG    Exercise Repetitions and Resistance Comments  Nustep  L4 x 15 minutes    Step up  8" step x 20 reps    Cybex knee flexion 40# x 3 minutes   Cybex knee extension 10# x 3 minutes    Ball Squeezes X3 mins   Seated hip ABD  Green t-band x 2 minutes    Rocker board 3 minutes   Tandem stance     Heel-ups/Toe ups X 25 each   14in box lunge     Step downs on wedge    Sit to stand X6 on mat table with focus on glutes    Blank cell = exercise not performed today                                                                PATIENT EDUCATION:  Education details: HEP, healing, POC, prognosis Person educated: Patient Education method: Explanation Education comprehension: verbalized understanding   HOME EXERCISE PROGRAM: [id number]  ASSESSMENT:  CLINICAL IMPRESSION: Pt arrives for today's treatment session denying any pain.  Pt reports falling after last treatment session.  Pt states that his right knee gave way when going down the steps. Due to this incident treatment scaled back today.  Pt denied any pain at completion of today's treatment session.  OBJECTIVE IMPAIRMENTS Abnormal gait, decreased balance, decreased mobility, difficulty walking, decreased strength, and pain.   ACTIVITY LIMITATIONS stairs, transfers, and locomotion level  PARTICIPATION LIMITATIONS: driving, community activity, occupation, and yard work  PERSONAL FACTORS Fitness, Time since onset of injury/illness/exacerbation, and 3+ comorbidities: HTN, DM, CKD  are also affecting patient's functional outcome.   REHAB POTENTIAL: Good  CLINICAL DECISION  MAKING: Stable/uncomplicated  EVALUATION COMPLEXITY: Low   GOALS: Goals reviewed with patient? No  SHORT TERM GOALS: Target date: 12/24/2021  Patient will be independent with his initial HEP.  Baseline: Goal status: INITIAL  2.  Patient will be able to transfer from sitting to standing with minimal upper extremity support.  Baseline:  Goal status: INITIAL  3.  Patient will improve his TUG time to 15 seconds or less for improved functional mobility.  Baseline:  Goal status: INITIAL  LONG TERM GOALS: Target date: 01/14/2022   Patient will be independent with his advanced HEP.  Baseline:  Goal status: INITIAL  2.  Patient will improve his TUG time to 12 seconds or less for improved safety.  Baseline:  Goal status: INITIAL  3.  Patient will be able to complete his daily activities without his familiar knee pain exceeding 2/10.  Baseline:  Goal status: INITIAL  PLAN: PT FREQUENCY: 2x/week  PT DURATION: 6 weeks  PLANNED INTERVENTIONS: Therapeutic exercises, Therapeutic activity, Neuromuscular re-education, Balance training, Gait training, Patient/Family education, Self Care, Joint mobilization, Stair training, Electrical stimulation, Cryotherapy, Moist heat, Taping, Vasopneumatic device, Manual therapy, and Re-evaluation  PLAN FOR NEXT SESSION: nustep, lower extremity strengthening and modalities as needed   Kathrynn Ducking, PTA 12/18/2021, 8:59 AM

## 2021-12-25 ENCOUNTER — Encounter: Payer: Self-pay | Admitting: *Deleted

## 2021-12-25 ENCOUNTER — Ambulatory Visit: Payer: Medicare PPO | Admitting: *Deleted

## 2021-12-25 DIAGNOSIS — R2681 Unsteadiness on feet: Secondary | ICD-10-CM

## 2021-12-25 DIAGNOSIS — G8929 Other chronic pain: Secondary | ICD-10-CM

## 2021-12-25 DIAGNOSIS — M6281 Muscle weakness (generalized): Secondary | ICD-10-CM

## 2021-12-25 NOTE — Therapy (Signed)
OUTPATIENT PHYSICAL THERAPY LOWER EXTREMITY TREATMENT   Patient Name: Kevin Jenkins MRN: [id number] DOB:04/21/1940, 81 y.o., male Today's Date: 12/25/2021   PT End of Session - 12/25/21 0826     Visit Number 6    Number of Visits 12    Date for PT Re-Evaluation 02/13/22    PT Start Time 0816    PT Stop Time 0902    PT Time Calculation (min) 46 min               Past Medical History:  Diagnosis Date   Chronic kidney disease    Hx. of kidney stones   Diabetes mellitus without complication ([place]) 0000000   Hypertension    Past Surgical History:  Procedure Laterality Date   CYSTOSCOPY WITH RETROGRADE PYELOGRAM, URETEROSCOPY AND STENT PLACEMENT Right 05/19/2014   Procedure: CYSTOSCOPY WITH RETROGRADE PYELOGRAM, RIGHT URETEROSCOPY LASER  AND REMOVED JJ STENT, REPLACED [place];  Surgeon: Carolan Clines, MD;  Location: [facility];  Service: Urology;  Laterality: Right;   CYSTOSCOPY/RETROGRADE/URETEROSCOPY/STONE EXTRACTION WITH BASKET  2007   LITHOTRIPSY     LITHOTRIPSY     LITHOTRIPSY     Patient Active Problem List   Diagnosis Date Noted   Hyperkalemia 05/15/2014   Acute renal failure superimposed on stage 4 chronic kidney disease ([place]) 05/15/2014   Diabetes ([place]) 05/15/2014   Benign essential HTN 05/15/2014   Leukocytosis 05/15/2014   Right ureteral stone [id number]   Metabolic acidosis    REFERRING PROVIDER: Lovett Calender, MD  REFERRING DIAG: Pain in right knee; Pain in left knee; Bilateral primary osteoarthritis of knee; Other symptoms and signs involving the musculoskeletal system  THERAPY DIAG:  Muscle weakness (generalized)  Unsteadiness on feet  Chronic pain of left knee  Chronic pain of right knee  Rationale for Evaluation and Treatment Rehabilitation  ONSET DATE: about 3 weeks ago  SUBJECTIVE:   SUBJECTIVE STATEMENT:  Pt is wearing bil knee braces today, but no walker. Over all doing better.  PERTINENT HISTORY: HTN, DM,  CKD  PAIN:  Are you having pain? No  PRECAUTIONS: Fall  WEIGHT BEARING RESTRICTIONS No  FALLS:  Has patient fallen in last 6 months? Yes. Number of falls 2; the first fall was about 2 months ago when he stepped in a hole, 2nd fall was getting out of a tractor  LIVING ENVIRONMENT: Lives with: lives with their family Lives in: House/apartment Stairs: Yes: Internal: 15 steps; can reach both and External: 3-5 steps; on left going up; step to pattern with LLE leading Has following equipment at home: None  OCCUPATION: Turf grass management; primarily driving a truck or tractor  PLOF: Independent  PATIENT GOALS reduced pain and improved strength   OBJECTIVE: all objective assessments were performed at his initial evaluation on 12/03/21 unless otherwise noted COGNITION:  Overall cognitive status: Within functional limits for tasks assessed     SENSATION: Patient reports no numbness or tingling  PALPATION: TTP: left gastroc   LOWER EXTREMITY ROM:  Active ROM Right eval Left eval  Hip flexion    Hip extension    Hip abduction    Hip adduction    Hip internal rotation    Hip external rotation    Knee flexion 125; painful 135; pain at end range  Knee extension 0 0  Ankle dorsiflexion    Ankle plantarflexion    Ankle inversion    Ankle eversion     (Blank rows = not tested)  LOWER EXTREMITY MMT:  MMT Right eval Left eval  Hip flexion 4-/5 4-/5  Hip extension    Hip abduction    Hip adduction    Hip internal rotation    Hip external rotation    Knee flexion 4/5 4/5  Knee extension 4/5 with anterior knee pain  4/5  Ankle dorsiflexion 4/5 4/5  Ankle plantarflexion    Ankle inversion    Ankle eversion     (Blank rows = not tested)  FUNCTIONAL TESTS:  5 times sit to stand: unable to perform without significant upper extremity support Timed up and go (TUG): 20.78 seconds  GAIT: Assistive device utilized: None Level of assistance: Complete  Independence Comments: decreased gait speed, wearing knee brace bilaterally   BALANCE:  Romberg: 30 seconds (EO) and 5 seconds (EC)   Tandem: 7 seconds with LLE leading and 13 seconds with RLE leading   TODAY'S TREATMENT:                                   11/9       EXERCISE LOG    Exercise Repetitions and Resistance Comments  Nustep  L4 x 15 minutes    Step up  6" step x 10 reps LT knee, unable to do on RT due to pain and buckling    Cybex knee flexion 40# x 3 minutes   Cybex knee extension 10# x 3 minutes    Ball Squeezes    Seated hip ABD  Green t-band x 2 minutes    Rocker board 3 minutes   PF/DF  and balance   Tandem stance     TKE's RT green band x 30   Heel-ups/Toe ups    14in box lunge     Step downs on wedge    Sit to stand 2x10   on mat table with focus on glutes    Blank cell = exercise not performed today                                                                PATIENT EDUCATION:  Education details: HEP, healing, POC, prognosis Person educated: Patient Education method: Explanation Education comprehension: verbalized understanding   HOME EXERCISE PROGRAM: [id number]  ASSESSMENT:  CLINICAL IMPRESSION: Pt arrives for today's treatment session denying any pain.  Pt reports no recent falls and that he can tell he is getting better with less knee pain and improved strength. Rx focused on strengthening and balance exercise progressions. Pt did well with Act.'s , but still with notable weakness and quad control with RT knee. Unable to control RT knee during step ups without knee trying to buckle, but did well with TKE's.       OBJECTIVE IMPAIRMENTS Abnormal gait, decreased balance, decreased mobility, difficulty walking, decreased strength, and pain.   ACTIVITY LIMITATIONS stairs, transfers, and locomotion level  PARTICIPATION LIMITATIONS: driving, community activity, occupation, and yard work  PERSONAL FACTORS Fitness, Time since onset of  injury/illness/exacerbation, and 3+ comorbidities: HTN, DM, CKD  are also affecting patient's functional outcome.   REHAB POTENTIAL: Good  CLINICAL DECISION MAKING: Stable/uncomplicated  EVALUATION COMPLEXITY: Low   GOALS: Goals reviewed with patient? No  SHORT TERM GOALS: Target date: 12/24/2021  Patient will be independent with his initial HEP.  Baseline: Goal status: INITIAL  2.  Patient will be able to transfer from sitting to standing with minimal upper extremity support.  Baseline:  Goal status: INITIAL  3.  Patient will improve his TUG time to 15 seconds or less for improved functional mobility.  Baseline:  Goal status: INITIAL  LONG TERM GOALS: Target date: 01/14/2022   Patient will be independent with his advanced HEP.  Baseline:  Goal status: INITIAL  2.  Patient will improve his TUG time to 12 seconds or less for improved safety.  Baseline:  Goal status: INITIAL  3.  Patient will be able to complete his daily activities without his familiar knee pain exceeding 2/10.  Baseline:  Goal status: INITIAL  PLAN: PT FREQUENCY: 2x/week  PT DURATION: 6 weeks  PLANNED INTERVENTIONS: Therapeutic exercises, Therapeutic activity, Neuromuscular re-education, Balance training, Gait training, Patient/Family education, Self Care, Joint mobilization, Stair training, Electrical stimulation, Cryotherapy, Moist heat, Taping, Vasopneumatic device, Manual therapy, and Re-evaluation  PLAN FOR NEXT SESSION: nustep, lower extremity strengthening and modalities as needed   Abygayle Deltoro,CHRIS, PTA 12/25/2021, 9:06 AM

## 2021-12-30 ENCOUNTER — Ambulatory Visit: Payer: Medicare PPO | Admitting: *Deleted

## 2021-12-30 ENCOUNTER — Encounter: Payer: Self-pay | Admitting: *Deleted

## 2021-12-30 DIAGNOSIS — M6281 Muscle weakness (generalized): Secondary | ICD-10-CM

## 2021-12-30 DIAGNOSIS — G8929 Other chronic pain: Secondary | ICD-10-CM

## 2021-12-30 DIAGNOSIS — R2681 Unsteadiness on feet: Secondary | ICD-10-CM

## 2021-12-30 NOTE — Therapy (Signed)
OUTPATIENT PHYSICAL THERAPY LOWER EXTREMITY TREATMENT   Patient Name: Kevin Jenkins MRN: [id number] DOB:07/01/1940, 81 y.o., male Today's Date: 12/25/2021   PT End of Session - 12/25/21 0826     Visit Number 6    Number of Visits 12    Date for PT Re-Evaluation 02/13/22    PT Start Time 0816    PT Stop Time 0902    PT Time Calculation (min) 46 min               Past Medical History:  Diagnosis Date   Chronic kidney disease    Hx. of kidney stones   Diabetes mellitus without complication (HCC) 2006ish   Hypertension    Past Surgical History:  Procedure Laterality Date   CYSTOSCOPY WITH RETROGRADE PYELOGRAM, URETEROSCOPY AND STENT PLACEMENT Right 05/19/2014   Procedure: CYSTOSCOPY WITH RETROGRADE PYELOGRAM, RIGHT URETEROSCOPY LASER  AND REMOVED JJ STENT, REPLACED JJ STENT  PLACEMENT;  Surgeon: Jethro Bolus, MD;  Location: [facility];  Service: Urology;  Laterality: Right;   CYSTOSCOPY/RETROGRADE/URETEROSCOPY/STONE EXTRACTION WITH BASKET  2007   LITHOTRIPSY     LITHOTRIPSY     LITHOTRIPSY     Patient Active Problem List   Diagnosis Date Noted   Hyperkalemia 05/15/2014   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) 05/15/2014   Diabetes (HCC) 05/15/2014   Benign essential HTN 05/15/2014   Leukocytosis 05/15/2014   Right ureteral stone 05/15/2014   Metabolic acidosis    REFERRING PROVIDER: Vernell Leep, MD  REFERRING DIAG: Pain in right knee; Pain in left knee; Bilateral primary osteoarthritis of knee; Other symptoms and signs involving the musculoskeletal system  THERAPY DIAG:  Muscle weakness (generalized)  Unsteadiness on feet  Chronic pain of left knee  Chronic pain of right knee  Rationale for Evaluation and Treatment Rehabilitation  ONSET DATE: about 3 weeks ago  SUBJECTIVE:   SUBJECTIVE STATEMENT:  Pt is wearing bil knee braces today. LT knee more painful today than RT.    PERTINENT HISTORY: HTN, DM,  CKD  PAIN:  Are you having pain? 3/10 LT knee  PRECAUTIONS: Fall  WEIGHT BEARING RESTRICTIONS No  FALLS:  Has patient fallen in last 6 months? Yes. Number of falls 2; the first fall was about 2 months ago when he stepped in a hole, 2nd fall was getting out of a tractor  LIVING ENVIRONMENT: Lives with: lives with their family Lives in: House/apartment Stairs: Yes: Internal: 15 steps; can reach both and External: 3-5 steps; on left going up; step to pattern with LLE leading Has following equipment at home: None  OCCUPATION: Turf grass management; primarily driving a truck or tractor  PLOF: Independent  PATIENT GOALS reduced pain and improved strength   OBJECTIVE: all objective assessments were performed at his initial evaluation on 12/03/21 unless otherwise noted   LOWER EXTREMITY ROM:  Active ROM Right eval Left eval  Hip flexion    Hip extension    Hip abduction    Hip adduction    Hip internal rotation    Hip external rotation    Knee flexion 125; painful 135; pain at end range  Knee extension 0 0  Ankle dorsiflexion    Ankle plantarflexion    Ankle inversion    Ankle eversion     (Blank rows = not tested)  LOWER EXTREMITY MMT:  MMT Right eval Left eval  Hip flexion 4-/5 4-/5  Hip extension    Hip abduction    Hip adduction  Hip internal rotation    Hip external rotation    Knee flexion 4/5 4/5  Knee extension 4/5 with anterior knee pain  4/5  Ankle dorsiflexion 4/5 4/5  Ankle plantarflexion    Ankle inversion    Ankle eversion     (Blank rows = not tested)  FUNCTIONAL TESTS:  5 times sit to stand: unable to perform without significant upper extremity support Timed up and go (TUG): 20.78 seconds  GAIT: Assistive device utilized: None Level of assistance: Complete Independence Comments: decreased gait speed, wearing knee brace bilaterally   BALANCE:  Romberg: 30 seconds (EO) and 5 seconds (EC)   Tandem: 7 seconds with LLE leading and 13  seconds with RLE leading   TODAY'S TREATMENT:                                   11/14       EXERCISE LOG    Exercise Repetitions and Resistance Comments  Nustep  L4 x 15 minutes    Step up  6" step x 10 reps LT knee, unable to do on RT due to pain and buckling    Cybex knee flexion 40# x 3 minutes   Cybex knee extension 10# x 3 minutes    Ball Squeezes    Seated hip ABD  Green t-band x 2 minutes    Rocker board 3 minutes   PF/DF  and balance   Tandem stance     TKE's RT green band x 30   Heel-ups/Toe ups    14in box lunge     Step downs on wedge    Sit to stand     Blank cell = exercise not performed today                                                                PATIENT EDUCATION:  Education details: HEP, healing, POC, prognosis Person educated: Patient Education method: Explanation Education comprehension: verbalized understanding   HOME EXERCISE PROGRAM: [id number]  ASSESSMENT:  CLINICAL IMPRESSION: Pt arrives for today's treatment session with increased pain LT knee, but not as bad on RT knee. Rx focused on LE strengthening [place] and CKC without increasing RT knee pain. RT knee pain at times around patella during some exs and needed to modify exs.Try patella mobs and SAQ with RT LE next session.      OBJECTIVE IMPAIRMENTS Abnormal gait, decreased balance, decreased mobility, difficulty walking, decreased strength, and pain.   ACTIVITY LIMITATIONS stairs, transfers, and locomotion level  PARTICIPATION LIMITATIONS: driving, community activity, occupation, and yard work  PERSONAL FACTORS Fitness, Time since onset of injury/illness/exacerbation, and 3+ comorbidities: HTN, DM, CKD  are also affecting patient's functional outcome.   REHAB POTENTIAL: Good  CLINICAL DECISION MAKING: Stable/uncomplicated  EVALUATION COMPLEXITY: Low   GOALS: Goals reviewed with patient? No  SHORT TERM GOALS: Target date: 12/24/2021  Patient will be independent with his initial  HEP.  Baseline: Goal status: INITIAL  2.  Patient will be able to transfer from sitting to standing with minimal upper extremity support.  Baseline:  Goal status: INITIAL  3.  Patient will improve his TUG time to 15 seconds or less for improved functional mobility.  Baseline:  Goal status: INITIAL  LONG TERM GOALS: Target date: 01/14/2022   Patient will be independent with his advanced HEP.  Baseline:  Goal status: INITIAL  2.  Patient will improve his TUG time to 12 seconds or less for improved safety.  Baseline:  Goal status: INITIAL  3.  Patient will be able to complete his daily activities without his familiar knee pain exceeding 2/10.  Baseline:  Goal status: INITIAL  PLAN: PT FREQUENCY: 2x/week  PT DURATION: 6 weeks  PLANNED INTERVENTIONS: Therapeutic exercises, Therapeutic activity, Neuromuscular re-education, Balance training, Gait training, Patient/Family education, Self Care, Joint mobilization, Stair training, Electrical stimulation, Cryotherapy, Moist heat, Taping, Vasopneumatic device, Manual therapy, and Re-evaluation  PLAN FOR NEXT SESSION: nustep, lower extremity strengthening and modalities as needed   Tenley Winward,CHRIS, PTA 12/25/2021, 9:06 AM

## 2022-01-01 ENCOUNTER — Ambulatory Visit: Payer: Medicare PPO

## 2022-01-01 DIAGNOSIS — M6281 Muscle weakness (generalized): Secondary | ICD-10-CM | POA: Diagnosis not present

## 2022-01-01 DIAGNOSIS — R2681 Unsteadiness on feet: Secondary | ICD-10-CM

## 2022-01-01 DIAGNOSIS — G8929 Other chronic pain: Secondary | ICD-10-CM

## 2022-01-01 NOTE — Therapy (Signed)
OUTPATIENT PHYSICAL THERAPY LOWER EXTREMITY TREATMENT   Patient Name: Kevin Jenkins MRN: [id number] DOB:1940-03-28, 81 y.o., male Today's Date: 01/01/2022   PT End of Session - 01/01/22 0840     Visit Number 8    Number of Visits 12    Date for PT Re-Evaluation 02/13/22    PT Start Time 0827   Patient arrived late to his appointment.   PT Stop Time [phone]   Patient had to leave early for another appointment.   PT Time Calculation (min) 28 min    Activity Tolerance Patient tolerated treatment well    Behavior During Therapy WFL for tasks assessed/performed               Past Medical History:  Diagnosis Date   Chronic kidney disease    Hx. of kidney stones   Diabetes mellitus without complication (HCC) 2006ish   Hypertension    Past Surgical History:  Procedure Laterality Date   CYSTOSCOPY WITH RETROGRADE PYELOGRAM, URETEROSCOPY AND STENT PLACEMENT Right 05/19/2014   Procedure: CYSTOSCOPY WITH RETROGRADE PYELOGRAM, RIGHT URETEROSCOPY LASER  AND REMOVED JJ STENT, REPLACED JJ STENT  PLACEMENT;  Surgeon: Jethro Bolus, MD;  Location: [facility];  Service: Urology;  Laterality: Right;   CYSTOSCOPY/RETROGRADE/URETEROSCOPY/STONE EXTRACTION WITH BASKET  2007   LITHOTRIPSY     LITHOTRIPSY     LITHOTRIPSY     Patient Active Problem List   Diagnosis Date Noted   Hyperkalemia 05/15/2014   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) 05/15/2014   Diabetes (HCC) 05/15/2014   Benign essential HTN 05/15/2014   Leukocytosis 05/15/2014   Right ureteral stone 05/15/2014   Metabolic acidosis    REFERRING PROVIDER: Vernell Leep, MD  REFERRING DIAG: Pain in right knee; Pain in left knee; Bilateral primary osteoarthritis of knee; Other symptoms and signs involving the musculoskeletal system  THERAPY DIAG:  Muscle weakness (generalized)  Unsteadiness on feet  Chronic pain of left knee  Chronic pain of right knee  Rationale for Evaluation and  Treatment Rehabilitation  ONSET DATE: about 3 weeks ago  SUBJECTIVE:   SUBJECTIVE STATEMENT: Patient reports that he thinks his knee is getting a little better. He notes that he has not had any problems since his last appointment.    PERTINENT HISTORY: HTN, DM, CKD  PAIN:  Are you having pain? 3/10 LT knee  PRECAUTIONS: Fall  WEIGHT BEARING RESTRICTIONS No  FALLS:  Has patient fallen in last 6 months? Yes. Number of falls 2; the first fall was about 2 months ago when he stepped in a hole, 2nd fall was getting out of a tractor  LIVING ENVIRONMENT: Lives with: lives with their family Lives in: House/apartment Stairs: Yes: Internal: 15 steps; can reach both and External: 3-5 steps; on left going up; step to pattern with LLE leading Has following equipment at home: None  OCCUPATION: Turf grass management; primarily driving a truck or tractor  PLOF: Independent  PATIENT GOALS reduced pain and improved strength   OBJECTIVE: all objective assessments were performed at his initial evaluation on 12/03/21 unless otherwise noted   LOWER EXTREMITY ROM:  Active ROM Right eval Left eval  Hip flexion    Hip extension    Hip abduction    Hip adduction    Hip internal rotation    Hip external rotation    Knee flexion 125; painful 135; pain at end range  Knee extension 0 0  Ankle dorsiflexion    Ankle plantarflexion  Ankle inversion    Ankle eversion     (Blank rows = not tested)  LOWER EXTREMITY MMT:  MMT Right eval Left eval  Hip flexion 4-/5 4-/5  Hip extension    Hip abduction    Hip adduction    Hip internal rotation    Hip external rotation    Knee flexion 4/5 4/5  Knee extension 4/5 with anterior knee pain  4/5  Ankle dorsiflexion 4/5 4/5  Ankle plantarflexion    Ankle inversion    Ankle eversion     (Blank rows = not tested)  FUNCTIONAL TESTS:  5 times sit to stand: unable to perform without significant upper extremity support Timed up and go  (TUG): 20.78 seconds  GAIT: Assistive device utilized: None Level of assistance: Complete Independence Comments: decreased gait speed, wearing knee brace bilaterally   BALANCE:  Romberg: 30 seconds (EO) and 5 seconds (EC)   Tandem: 7 seconds with LLE leading and 13 seconds with RLE leading   TODAY'S TREATMENT:                                   11/16 EXERCISE LOG  Exercise Repetitions and Resistance Comments  Nustep  L5 x 15 minutes   Cybex knee flexion  50# x 3 minutes   Cybex knee extension 10# x 2 minutes   Lateral step up  6" step x 2 minutes   Heel raises  30 reps    Toe raises  30 reps     Blank cell = exercise not performed today                                    11/14       EXERCISE LOG    Exercise Repetitions and Resistance Comments  Nustep  L4 x 15 minutes    Step up  6" step x 10 reps LT knee, unable to do on RT due to pain and buckling    Cybex knee flexion 40# x 3 minutes   Cybex knee extension 10# x 3 minutes    Ball Squeezes    Seated hip ABD  Green t-band x 2 minutes    Rocker board 3 minutes   PF/DF  and balance   Tandem stance     TKE's RT green band x 30   Heel-ups/Toe ups    14in box lunge     Step downs on wedge    Sit to stand     Blank cell = exercise not performed today                                                                PATIENT EDUCATION:  Education details: HEP, healing, POC, prognosis Person educated: Patient Education method: Explanation Education comprehension: verbalized understanding   HOME EXERCISE PROGRAM: [id number]  ASSESSMENT:  CLINICAL IMPRESSION: Patient arrived late to his appointment which limited his ability to be progressed with new interventions. He required minimal cueing with today's interventions for proper exercise performance and pacing he reported no significant pain or discomfort with any of today's interventions. He feels that his knees are  alright upon the conclusion of treatment. He continues to  require skilled physical therapy to address his remaining impairments to maximize his functional mobility and safety.   OBJECTIVE IMPAIRMENTS Abnormal gait, decreased balance, decreased mobility, difficulty walking, decreased strength, and pain.   ACTIVITY LIMITATIONS stairs, transfers, and locomotion level  PARTICIPATION LIMITATIONS: driving, community activity, occupation, and yard work  PERSONAL FACTORS Fitness, Time since onset of injury/illness/exacerbation, and 3+ comorbidities: HTN, DM, CKD  are also affecting patient's functional outcome.   REHAB POTENTIAL: Good  CLINICAL DECISION MAKING: Stable/uncomplicated  EVALUATION COMPLEXITY: Low   GOALS: Goals reviewed with patient? No  SHORT TERM GOALS: Target date: 12/24/2021  Patient will be independent with his initial HEP.  Baseline: Goal status: INITIAL  2.  Patient will be able to transfer from sitting to standing with minimal upper extremity support.  Baseline:  Goal status: INITIAL  3.  Patient will improve his TUG time to 15 seconds or less for improved functional mobility.  Baseline:  Goal status: INITIAL  LONG TERM GOALS: Target date: 01/14/2022   Patient will be independent with his advanced HEP.  Baseline:  Goal status: INITIAL  2.  Patient will improve his TUG time to 12 seconds or less for improved safety.  Baseline:  Goal status: INITIAL  3.  Patient will be able to complete his daily activities without his familiar knee pain exceeding 2/10.  Baseline:  Goal status: INITIAL  PLAN: PT FREQUENCY: 2x/week  PT DURATION: 6 weeks  PLANNED INTERVENTIONS: Therapeutic exercises, Therapeutic activity, Neuromuscular re-education, Balance training, Gait training, Patient/Family education, Self Care, Joint mobilization, Stair training, Electrical stimulation, Cryotherapy, Moist heat, Taping, Vasopneumatic device, Manual therapy, and Re-evaluation  PLAN FOR NEXT SESSION: nustep, lower extremity strengthening and  modalities as needed   Granville Lewis, PT 01/01/2022, 12:35 PM

## 2022-01-06 ENCOUNTER — Ambulatory Visit: Payer: Medicare PPO

## 2022-01-06 DIAGNOSIS — G8929 Other chronic pain: Secondary | ICD-10-CM

## 2022-01-06 DIAGNOSIS — R2681 Unsteadiness on feet: Secondary | ICD-10-CM

## 2022-01-06 DIAGNOSIS — M6281 Muscle weakness (generalized): Secondary | ICD-10-CM | POA: Diagnosis not present

## 2022-01-06 NOTE — Therapy (Signed)
OUTPATIENT PHYSICAL THERAPY LOWER EXTREMITY TREATMENT   Patient Name: Kevin Jenkins MRN: [id number] DOB:Jul 30, 1940, 81 y.o., male Today's Date: 01/06/2022   PT End of Session - 01/06/22 0823     Visit Number 9    Number of Visits 12    Date for PT Re-Evaluation 02/13/22    PT Start Time 0819    PT Stop Time 0855   Patient requested to leave early.   PT Time Calculation (min) 36 min    Activity Tolerance Patient tolerated treatment well    Behavior During Therapy WFL for tasks assessed/performed               Past Medical History:  Diagnosis Date   Chronic kidney disease    Hx. of kidney stones   Diabetes mellitus without complication (HCC) 2006ish   Hypertension    Past Surgical History:  Procedure Laterality Date   CYSTOSCOPY WITH RETROGRADE PYELOGRAM, URETEROSCOPY AND STENT PLACEMENT Right 05/19/2014   Procedure: CYSTOSCOPY WITH RETROGRADE PYELOGRAM, RIGHT URETEROSCOPY LASER  AND REMOVED JJ STENT, REPLACED JJ STENT  PLACEMENT;  Surgeon: Jethro Bolus, MD;  Location: [facility];  Service: Urology;  Laterality: Right;   CYSTOSCOPY/RETROGRADE/URETEROSCOPY/STONE EXTRACTION WITH BASKET  2007   LITHOTRIPSY     LITHOTRIPSY     LITHOTRIPSY     Patient Active Problem List   Diagnosis Date Noted   Hyperkalemia 05/15/2014   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) 05/15/2014   Diabetes (HCC) 05/15/2014   Benign essential HTN 05/15/2014   Leukocytosis 05/15/2014   Right ureteral stone 05/15/2014   Metabolic acidosis    REFERRING PROVIDER: Vernell Leep, MD  REFERRING DIAG: Pain in right knee; Pain in left knee; Bilateral primary osteoarthritis of knee; Other symptoms and signs involving the musculoskeletal system  THERAPY DIAG:  Muscle weakness (generalized)  Unsteadiness on feet  Chronic pain of left knee  Chronic pain of right knee  Rationale for Evaluation and Treatment Rehabilitation  ONSET DATE: about 3 weeks  ago  SUBJECTIVE:   SUBJECTIVE STATEMENT: Patient reports that he feels like he is getting better, but he is still wearing his knee braces.   PERTINENT HISTORY: HTN, DM, CKD  PAIN:  Are you having pain? 3/10 LT knee  PRECAUTIONS: Fall  WEIGHT BEARING RESTRICTIONS No  FALLS:  Has patient fallen in last 6 months? Yes. Number of falls 2; the first fall was about 2 months ago when he stepped in a hole, 2nd fall was getting out of a tractor  LIVING ENVIRONMENT: Lives with: lives with their family Lives in: House/apartment Stairs: Yes: Internal: 15 steps; can reach both and External: 3-5 steps; on left going up; step to pattern with LLE leading Has following equipment at home: None  OCCUPATION: Turf grass management; primarily driving a truck or tractor  PLOF: Independent  PATIENT GOALS reduced pain and improved strength   OBJECTIVE: all objective assessments were performed at his initial evaluation on 12/03/21 unless otherwise noted   LOWER EXTREMITY ROM:  Active ROM Right eval Left eval  Hip flexion    Hip extension    Hip abduction    Hip adduction    Hip internal rotation    Hip external rotation    Knee flexion 125; painful 135; pain at end range  Knee extension 0 0  Ankle dorsiflexion    Ankle plantarflexion    Ankle inversion    Ankle eversion     (Blank rows = not tested)  LOWER EXTREMITY MMT:  MMT Right eval Left eval  Hip flexion 4-/5 4-/5  Hip extension    Hip abduction    Hip adduction    Hip internal rotation    Hip external rotation    Knee flexion 4/5 4/5  Knee extension 4/5 with anterior knee pain  4/5  Ankle dorsiflexion 4/5 4/5  Ankle plantarflexion    Ankle inversion    Ankle eversion     (Blank rows = not tested)  FUNCTIONAL TESTS:  5 times sit to stand: unable to perform without significant upper extremity support Timed up and go (TUG): 20.78 seconds  GAIT: Assistive device utilized: None Level of assistance: Complete  Independence Comments: decreased gait speed, wearing knee brace bilaterally   BALANCE:  Romberg: 30 seconds (EO) and 5 seconds (EC)   Tandem: 7 seconds with LLE leading and 13 seconds with RLE leading   TODAY'S TREATMENT:                                   11/21 EXERCISE LOG  Exercise Repetitions and Resistance Comments  Nustep L5 x 15 minutes   Cybex knee flexion 50# x 3 minutes   Cybex knee extension 10# x 3 minutes   Standing hip ABD Green t-band x 2 minutes   Standing hip extension Green t-band x 2 minutes   Gastroc stretch  3 x 30 seconds         Blank cell = exercise not performed today                                    11/16 EXERCISE LOG  Exercise Repetitions and Resistance Comments  Nustep  L5 x 15 minutes   Cybex knee flexion  50# x 3 minutes   Cybex knee extension 10# x 2 minutes   Lateral step up  6" step x 2 minutes   Heel raises  30 reps    Toe raises  30 reps     Blank cell = exercise not performed today                                    11/14       EXERCISE LOG    Exercise Repetitions and Resistance Comments  Nustep  L4 x 15 minutes    Step up  6" step x 10 reps LT knee, unable to do on RT due to pain and buckling    Cybex knee flexion 40# x 3 minutes   Cybex knee extension 10# x 3 minutes    Ball Squeezes    Seated hip ABD  Green t-band x 2 minutes    Rocker board 3 minutes   PF/DF  and balance   Tandem stance     TKE's RT green band x 30   Heel-ups/Toe ups    14in box lunge     Step downs on wedge    Sit to stand     Blank cell = exercise not performed today  PATIENT EDUCATION:  Education details: HEP, healing, POC, prognosis Person educated: Patient Education method: Explanation Education comprehension: verbalized understanding   HOME EXERCISE PROGRAM: [id number]  ASSESSMENT:  CLINICAL IMPRESSION: Treatment focused on familiar interventions for improved lower extremity  strength needed for improved function with his daily activities. He required minimal cueing with standing hip extension to prevent lumbar flexion to isolate posterior chain engagement.  He experienced the most difficulty with the weighted knee extension as he required a brief rest break after this intervention due to fatigue. He reported feeling alright upon the conclusion of treatment. He continues to require skilled physical therapy to address his remaining impairments to return to his prior level of function.   OBJECTIVE IMPAIRMENTS Abnormal gait, decreased balance, decreased mobility, difficulty walking, decreased strength, and pain.   ACTIVITY LIMITATIONS stairs, transfers, and locomotion level  PARTICIPATION LIMITATIONS: driving, community activity, occupation, and yard work  PERSONAL FACTORS Fitness, Time since onset of injury/illness/exacerbation, and 3+ comorbidities: HTN, DM, CKD  are also affecting patient's functional outcome.   REHAB POTENTIAL: Good  CLINICAL DECISION MAKING: Stable/uncomplicated  EVALUATION COMPLEXITY: Low   GOALS: Goals reviewed with patient? No  SHORT TERM GOALS: Target date: 12/24/2021  Patient will be independent with his initial HEP.  Baseline: Goal status: INITIAL  2.  Patient will be able to transfer from sitting to standing with minimal upper extremity support.  Baseline:  Goal status: INITIAL  3.  Patient will improve his TUG time to 15 seconds or less for improved functional mobility.  Baseline:  Goal status: INITIAL  LONG TERM GOALS: Target date: 01/14/2022   Patient will be independent with his advanced HEP.  Baseline:  Goal status: INITIAL  2.  Patient will improve his TUG time to 12 seconds or less for improved safety.  Baseline:  Goal status: INITIAL  3.  Patient will be able to complete his daily activities without his familiar knee pain exceeding 2/10.  Baseline:  Goal status: INITIAL  PLAN: PT FREQUENCY: 2x/week  PT  DURATION: 6 weeks  PLANNED INTERVENTIONS: Therapeutic exercises, Therapeutic activity, Neuromuscular re-education, Balance training, Gait training, Patient/Family education, Self Care, Joint mobilization, Stair training, Electrical stimulation, Cryotherapy, Moist heat, Taping, Vasopneumatic device, Manual therapy, and Re-evaluation  PLAN FOR NEXT SESSION: nustep, lower extremity strengthening and modalities as needed   Granville Lewis, PT 01/06/2022, 2:12 PM

## 2022-01-13 ENCOUNTER — Ambulatory Visit: Payer: Medicare PPO

## 2022-01-15 ENCOUNTER — Encounter: Payer: Self-pay | Admitting: *Deleted

## 2022-01-15 ENCOUNTER — Ambulatory Visit: Payer: Medicare PPO | Admitting: *Deleted

## 2022-01-15 DIAGNOSIS — M6281 Muscle weakness (generalized): Secondary | ICD-10-CM

## 2022-01-15 DIAGNOSIS — R2681 Unsteadiness on feet: Secondary | ICD-10-CM

## 2022-01-15 DIAGNOSIS — G8929 Other chronic pain: Secondary | ICD-10-CM

## 2022-01-15 NOTE — Therapy (Signed)
OUTPATIENT PHYSICAL THERAPY LOWER EXTREMITY TREATMENT   Patient Name: Kevin Jenkins MRN: [id number] DOB:04/04/1940, 81 y.o., male Today's Date: 01/15/2022   PT End of Session - 01/15/22 0834     Visit Number 10    Number of Visits 12    Date for PT Re-Evaluation 02/13/22    PT Start Time 0820    PT Stop Time 0905    PT Time Calculation (min) 45 min               Past Medical History:  Diagnosis Date   Chronic kidney disease    Hx. of kidney stones   Diabetes mellitus without complication ([place]) [id number]   Hypertension    Past Surgical History:  Procedure Laterality Date   CYSTOSCOPY WITH RETROGRADE PYELOGRAM, URETEROSCOPY AND STENT PLACEMENT Right 05/19/2014   Procedure: CYSTOSCOPY WITH RETROGRADE PYELOGRAM, RIGHT URETEROSCOPY LASER  AND REMOVED JJ STENT, REPLACED [place];  Surgeon: Carolan Clines, MD;  Location: [facility];  Service: Urology;  Laterality: Right;   CYSTOSCOPY/RETROGRADE/URETEROSCOPY/STONE EXTRACTION WITH BASKET  2007   LITHOTRIPSY     LITHOTRIPSY     LITHOTRIPSY     Patient Active Problem List   Diagnosis Date Noted   Hyperkalemia 05/15/2014   Acute renal failure superimposed on stage 4 chronic kidney disease ([place]) 05/15/2014   Diabetes ([place]) 05/15/2014   Benign essential HTN 05/15/2014   Leukocytosis 05/15/2014   Right ureteral stone 17/40/8144   Metabolic acidosis    REFERRING PROVIDER: Lovett Calender, MD  REFERRING DIAG: Pain in right knee; Pain in left knee; Bilateral primary osteoarthritis of knee; Other symptoms and signs involving the musculoskeletal system  THERAPY DIAG:  Muscle weakness (generalized)  Unsteadiness on feet  Chronic pain of left knee  Chronic pain of right knee  Rationale for Evaluation and Treatment Rehabilitation  ONSET DATE: about 3 weeks ago  SUBJECTIVE:   SUBJECTIVE STATEMENT: Patient reports that he feels like he is getting better. My LT big toe is broken as per MD/ Xray   PERTINENT  HISTORY: HTN, DM, CKD  PAIN:  Are you having pain? 3/10 LT knee  PRECAUTIONS: Fall  WEIGHT BEARING RESTRICTIONS No  FALLS:  Has patient fallen in last 6 months? Yes. Number of falls 2; the first fall was about 2 months ago when he stepped in a hole, 2nd fall was getting out of a tractor  LIVING ENVIRONMENT: Lives with: lives with their family Lives in: House/apartment Stairs: Yes: Internal: 15 steps; can reach both and External: 3-5 steps; on left going up; step to pattern with LLE leading Has following equipment at home: None  OCCUPATION: Turf grass management; primarily driving a truck or tractor  PLOF: Independent  PATIENT GOALS reduced pain and improved strength   OBJECTIVE: all objective assessments were performed at his initial evaluation on 12/03/21 unless otherwise noted FUNCTIONAL TESTS:  5 times sit to stand: unable to perform without significant upper extremity support Timed up and go (TUG): 20.78 seconds  GAIT: Assistive device utilized: None Level of assistance: Complete Independence Comments: decreased gait speed, wearing knee brace bilaterally   BALANCE:  Romberg: 30 seconds (EO) and 5 seconds (EC)   Tandem: 7 seconds with LLE leading and 13 seconds with RLE leading   TODAY'S TREATMENT:                                   11/30 EXERCISE LOG  Exercise Repetitions and Resistance Comments  Nustep L5 x 17 minutes   Cybex knee flexion 40# x 3 minutes   Cybex knee extension 10# x 3 minutes   Standing hip ABD    Standing hip extension    Gastroc stretch  Rocker board  X 5 mins PF/DF balance   14in box lunge  X20 each LE with focus on quad control   Seated Clamshells Green tband x 15 reps hold 5 secs    Blank cell = exercise not performed today                                    11/16 EXERCISE LOG  Exercise Repetitions and Resistance Comments  Nustep  L5 x 15 minutes   Cybex knee flexion  50# x 3 minutes   Cybex knee extension 10# x 2 minutes    Lateral step up  6" step x 2 minutes   Heel raises  30 reps    Toe raises  30 reps     Blank cell = exercise not performed today                                    11/14       EXERCISE LOG    Exercise Repetitions and Resistance Comments  Nustep  L4 x 15 minutes    Step up  6" step x 10 reps LT knee, unable to do on RT due to pain and buckling    Cybex knee flexion 40# x 3 minutes   Cybex knee extension 10# x 3 minutes    Ball Squeezes    Seated hip ABD  Green t-band x 2 minutes    Rocker board 3 minutes   PF/DF  and balance   Tandem stance     TKE's RT green band x 30   Heel-ups/Toe ups    14in box lunge     Step downs on wedge    Sit to stand     Blank cell = exercise not performed today                                                                PATIENT EDUCATION:  Education details: HEP, healing, POC, prognosis Person educated: Patient Education method: Explanation Education comprehension: verbalized understanding   HOME EXERCISE PROGRAM: [id number]  ASSESSMENT:  CLINICAL IMPRESSION: Pt arrived today doing fairly well with knee pain, and reports that his LT great toe is fractured as per MD/ X-ray. Rx focused on [facility] and [facility] exs for both LE's with minimal increase in knee pain. Pt feels like he is progressing with strength since starting PT, but Knee pain continues to limit his progression.  01/15/22 PROGRESS REPORT: Patient has made minimal progress with skilled physical therapy as evidenced by his functional mobility and progress toward his goals. However, this could partially be attributed to his fracture of the great toe on the left foot. Recommend that he continue with skilled physical therapy to address his remaining impairments to maximize his safety and functional mobility.   Jacqulynn Cadet, PT, DPT   OBJECTIVE IMPAIRMENTS Abnormal  gait, decreased balance, decreased mobility, difficulty walking, decreased strength, and pain.   ACTIVITY LIMITATIONS stairs,  transfers, and locomotion level  PARTICIPATION LIMITATIONS: driving, community activity, occupation, and yard work  PERSONAL FACTORS Fitness, Time since onset of injury/illness/exacerbation, and 3+ comorbidities: HTN, DM, CKD  are also affecting patient's functional outcome.   REHAB POTENTIAL: Good  CLINICAL DECISION MAKING: Stable/uncomplicated  EVALUATION COMPLEXITY: Low   GOALS: Goals reviewed with patient? No  SHORT TERM GOALS: Target date: 12/24/2021  Patient will be independent with his initial HEP.  Baseline: Goal status: MET  2.  Patient will be able to transfer from sitting to standing with minimal upper extremity support.  Baseline:  Goal status: On Going  3.  Patient will improve his TUG time to 15 seconds or less for improved functional mobility.  Baseline:  Goal status: On-going  LONG TERM GOALS: Target date: 01/14/2022   Patient will be independent with his advanced HEP.  Baseline:  Goal status: On Going  2.  Patient will improve his TUG time to 12 seconds or less for improved safety.  Baseline:  Goal status: On Going  3.  Patient will be able to complete his daily activities without his familiar knee pain exceeding 2/10.  Baseline:  Goal status: On going  PLAN: PT FREQUENCY: 2x/week  PT DURATION: 6 weeks  PLANNED INTERVENTIONS: Therapeutic exercises, Therapeutic activity, Neuromuscular re-education, Balance training, Gait training, Patient/Family education, Self Care, Joint mobilization, Stair training, Electrical stimulation, Cryotherapy, Moist heat, Taping, Vasopneumatic device, Manual therapy, and Re-evaluation  PLAN FOR NEXT SESSION: nustep, lower extremity strengthening and modalities as needed   Hallie Ertl,CHRIS, PTA 01/15/2022, 9:59 AM

## 2022-01-22 ENCOUNTER — Ambulatory Visit: Payer: Medicare PPO | Attending: Family Medicine | Admitting: *Deleted

## 2022-01-22 ENCOUNTER — Encounter: Payer: Self-pay | Admitting: *Deleted

## 2022-01-22 DIAGNOSIS — G8929 Other chronic pain: Secondary | ICD-10-CM | POA: Insufficient documentation

## 2022-01-22 DIAGNOSIS — M25562 Pain in left knee: Secondary | ICD-10-CM | POA: Diagnosis present

## 2022-01-22 DIAGNOSIS — M6281 Muscle weakness (generalized): Secondary | ICD-10-CM | POA: Diagnosis present

## 2022-01-22 DIAGNOSIS — M25561 Pain in right knee: Secondary | ICD-10-CM | POA: Diagnosis present

## 2022-01-22 DIAGNOSIS — R2681 Unsteadiness on feet: Secondary | ICD-10-CM | POA: Diagnosis present

## 2022-01-22 NOTE — Therapy (Signed)
OUTPATIENT PHYSICAL THERAPY LOWER EXTREMITY TREATMENT   Patient Name: Kevin Jenkins MRN: [id number] DOB:March 30, 1940, 81 y.o., male Today's Date: 01/22/2022   PT End of Session - 01/22/22 0831     Visit Number 11    Number of Visits 12    Date for PT Re-Evaluation 02/13/22    PT Start Time 0820    PT Stop Time 0905    PT Time Calculation (min) 45 min               Past Medical History:  Diagnosis Date   Chronic kidney disease    Hx. of kidney stones   Diabetes mellitus without complication ([place]) [id number]   Hypertension    Past Surgical History:  Procedure Laterality Date   CYSTOSCOPY WITH RETROGRADE PYELOGRAM, URETEROSCOPY AND STENT PLACEMENT Right 05/19/2014   Procedure: CYSTOSCOPY WITH RETROGRADE PYELOGRAM, RIGHT URETEROSCOPY LASER  AND REMOVED JJ STENT, REPLACED [place];  Surgeon: Carolan Clines, MD;  Location: [facility];  Service: Urology;  Laterality: Right;   CYSTOSCOPY/RETROGRADE/URETEROSCOPY/STONE EXTRACTION WITH BASKET  2007   LITHOTRIPSY     LITHOTRIPSY     LITHOTRIPSY     Patient Active Problem List   Diagnosis Date Noted   Hyperkalemia 05/15/2014   Acute renal failure superimposed on stage 4 chronic kidney disease ([place]) 05/15/2014   Diabetes ([place]) 05/15/2014   Benign essential HTN 05/15/2014   Leukocytosis 05/15/2014   Right ureteral stone 02/08/8249   Metabolic acidosis    REFERRING PROVIDER: Lovett Calender, MD  REFERRING DIAG: Pain in right knee; Pain in left knee; Bilateral primary osteoarthritis of knee; Other symptoms and signs involving the musculoskeletal system  THERAPY DIAG:  Muscle weakness (generalized)  Chronic pain of left knee  Unsteadiness on feet  Chronic pain of right knee  Rationale for Evaluation and Treatment Rehabilitation  ONSET DATE: about 3 weeks ago  SUBJECTIVE:   SUBJECTIVE STATEMENT: Patient reports that he is doing much better now , but wants to keep strengthening  PERTINENT HISTORY: HTN, DM,  CKD  PAIN:  Are you having pain? 3/10 LT knee  PRECAUTIONS: Fall  WEIGHT BEARING RESTRICTIONS No  FALLS:  Has patient fallen in last 6 months? Yes. Number of falls 2; the first fall was about 2 months ago when he stepped in a hole, 2nd fall was getting out of a tractor  LIVING ENVIRONMENT: Lives with: lives with their family Lives in: House/apartment Stairs: Yes: Internal: 15 steps; can reach both and External: 3-5 steps; on left going up; step to pattern with LLE leading Has following equipment at home: None  OCCUPATION: Turf grass management; primarily driving a truck or tractor  PLOF: Independent  PATIENT GOALS reduced pain and improved strength   OBJECTIVE: all objective assessments were performed at his initial evaluation on 12/03/21 unless otherwise noted FUNCTIONAL TESTS:  5 times sit to stand: unable to perform without significant upper extremity support Timed up and go (TUG): 20.78 seconds  GAIT: Assistive device utilized: None Level of assistance: Complete Independence Comments: decreased gait speed, wearing knee brace bilaterally   BALANCE:  Romberg: 30 seconds (EO) and 5 seconds (EC)   Tandem: 7 seconds with LLE leading and 13 seconds with RLE leading   TODAY'S TREATMENT:                                   01-22-22 EXERCISE LOG  Exercise Repetitions and Resistance Comments  Nustep L5 x 18 minutes   Cybex knee flexion 40# x 3 minutes   Cybex knee extension 10# x 3 minutes   Standing hip ABD    Standing hip extension    Gastroc stretch  Rocker board  X 5 mins PF/DF balance   14in box lunge  X20 each LE with focus on quad control   Seated Clamshells    Sit to stand 2x5 with UE assist    Blank cell = exercise not performed today   TUG test x3     best time 14 secs                                     11/16 EXERCISE LOG  Exercise Repetitions and Resistance Comments  Nustep  L5 x 15 minutes   Cybex knee flexion  50# x 3 minutes   Cybex knee extension  10# x 2 minutes   Lateral step up  6" step x 2 minutes   Heel raises  30 reps    Toe raises  30 reps     Blank cell = exercise not performed today                                    11/14       EXERCISE LOG    Exercise Repetitions and Resistance Comments  Nustep  L4 x 15 minutes    Step up  6" step x 10 reps LT knee, unable to do on RT due to pain and buckling    Cybex knee flexion 40# x 3 minutes   Cybex knee extension 10# x 3 minutes    Ball Squeezes    Seated hip ABD  Green t-band x 2 minutes    Rocker board 3 minutes   PF/DF  and balance   Tandem stance     TKE's RT green band x 30   Heel-ups/Toe ups    14in box lunge     Step downs on wedge    Sit to stand     Blank cell = exercise not performed today                                                                PATIENT EDUCATION:  Education details: HEP, healing, POC, prognosis Person educated: Patient Education method: Explanation Education comprehension: verbalized understanding   HOME EXERCISE PROGRAM: [id number]  ASSESSMENT:  CLINICAL IMPRESSION: Pt arrived today doing fairly well. Rx focused on strengthening for Bil. Les as well as balance. He continues to progress and has met STG's , but not all LTG's due to weakness/pain mainly RT knee.   01/15/22 PROGRESS REPORT: Patient has made minimal progress with skilled physical therapy as evidenced by his functional mobility and progress toward his goals. However, this could partially be attributed to his fracture of the great toe on the left foot. Recommend that he continue with skilled physical therapy to address his remaining impairments to maximize his safety and functional mobility.   Jacqulynn Cadet, PT, DPT   OBJECTIVE IMPAIRMENTS Abnormal gait, decreased balance, decreased mobility, difficulty walking, decreased strength, and  pain.   ACTIVITY LIMITATIONS stairs, transfers, and locomotion level  PARTICIPATION LIMITATIONS: driving, community activity,  occupation, and yard work  PERSONAL FACTORS Fitness, Time since onset of injury/illness/exacerbation, and 3+ comorbidities: HTN, DM, CKD  are also affecting patient's functional outcome.   REHAB POTENTIAL: Good  CLINICAL DECISION MAKING: Stable/uncomplicated  EVALUATION COMPLEXITY: Low   GOALS: Goals reviewed with patient? No  SHORT TERM GOALS: Target date: 12/24/2021  Patient will be independent with his initial HEP.  Baseline: Goal status: MET  2.  Patient will be able to transfer from sitting to standing with minimal upper extremity support.  Baseline:  Goal status: MET  3.  Patient will improve his TUG time to 15 seconds or less for improved functional mobility.  Baseline:  Goal status: MET      14 secs  LONG TERM GOALS: Target date: 01/14/2022   Patient will be independent with his advanced HEP.  Baseline:  Goal status: On Going  2.  Patient will improve his TUG time to 12 seconds or less for improved safety.  Baseline:  Goal status: On Going  3.  Patient will be able to complete his daily activities without his familiar knee pain exceeding 2/10.  Baseline:  Goal status: On going  PLAN: PT FREQUENCY: 2x/week  PT DURATION: 6 weeks  PLANNED INTERVENTIONS: Therapeutic exercises, Therapeutic activity, Neuromuscular re-education, Balance training, Gait training, Patient/Family education, Self Care, Joint mobilization, Stair training, Electrical stimulation, Cryotherapy, Moist heat, Taping, Vasopneumatic device, Manual therapy, and Re-evaluation  PLAN FOR NEXT SESSION: nustep, lower extremity strengthening and modalities as needed   Arnie Maiolo,CHRIS, PTA 01/22/2022, 9:40 AM

## 2022-01-27 ENCOUNTER — Ambulatory Visit: Payer: Medicare PPO | Admitting: Physical Therapy

## 2022-01-27 ENCOUNTER — Encounter: Payer: Self-pay | Admitting: Physical Therapy

## 2022-01-27 DIAGNOSIS — G8929 Other chronic pain: Secondary | ICD-10-CM

## 2022-01-27 DIAGNOSIS — M6281 Muscle weakness (generalized): Secondary | ICD-10-CM

## 2022-01-27 DIAGNOSIS — R2681 Unsteadiness on feet: Secondary | ICD-10-CM

## 2022-01-27 NOTE — Therapy (Signed)
OUTPATIENT PHYSICAL THERAPY LOWER EXTREMITY TREATMENT   Patient Name: Kevin Jenkins MRN: [id number] DOB:April 16, 1940, 81 y.o., male Today's Date: 01/27/2022   PT End of Session - 01/27/22 0842     Visit Number 12    Number of Visits 18    Date for PT Re-Evaluation 03/13/22    PT Start Time 0826    Activity Tolerance Patient tolerated treatment well    Behavior During Therapy [facility] for tasks assessed/performed               Past Medical History:  Diagnosis Date   Chronic kidney disease    Hx. of kidney stones   Diabetes mellitus without complication ([place]) [id number]   Hypertension    Past Surgical History:  Procedure Laterality Date   CYSTOSCOPY WITH RETROGRADE PYELOGRAM, URETEROSCOPY AND STENT PLACEMENT Right 05/19/2014   Procedure: CYSTOSCOPY WITH RETROGRADE PYELOGRAM, RIGHT URETEROSCOPY LASER  AND REMOVED JJ STENT, REPLACED [place];  Surgeon: Carolan Clines, MD;  Location: [facility];  Service: Urology;  Laterality: Right;   CYSTOSCOPY/RETROGRADE/URETEROSCOPY/STONE EXTRACTION WITH BASKET  2007   LITHOTRIPSY     LITHOTRIPSY     LITHOTRIPSY     Patient Active Problem List   Diagnosis Date Noted   Hyperkalemia 05/15/2014   Acute renal failure superimposed on stage 4 chronic kidney disease ([place]) 05/15/2014   Diabetes ([place]) 05/15/2014   Benign essential HTN 05/15/2014   Leukocytosis 05/15/2014   Right ureteral stone 95/28/4132   Metabolic acidosis    REFERRING PROVIDER: Lovett Calender, MD  REFERRING DIAG: Pain in right knee; Pain in left knee; Bilateral primary osteoarthritis of knee; Other symptoms and signs involving the musculoskeletal system  THERAPY DIAG:  Muscle weakness (generalized)  Chronic pain of left knee  Unsteadiness on feet  Chronic pain of right knee  Rationale for Evaluation and Treatment Rehabilitation  ONSET DATE: about 3 weeks ago  SUBJECTIVE:   SUBJECTIVE STATEMENT: Doing better.  PERTINENT HISTORY: HTN, DM,  CKD  PAIN:  Are you having pain? 3/10 LT knee  PRECAUTIONS: Fall  WEIGHT BEARING RESTRICTIONS No  FALLS:  Has patient fallen in last 6 months? Yes. Number of falls 2; the first fall was about 2 months ago when he stepped in a hole, 2nd fall was getting out of a tractor  LIVING ENVIRONMENT: Lives with: lives with their family Lives in: House/apartment Stairs: Yes: Internal: 15 steps; can reach both and External: 3-5 steps; on left going up; step to pattern with LLE leading Has following equipment at home: None  OCCUPATION: Turf grass management; primarily driving a truck or tractor  PLOF: Independent  PATIENT GOALS reduced pain and improved strength   OBJECTIVE:  Tandem: 7 seconds with LLE leading and 13 seconds with RLE leading   TODAY'S TREATMENT:                                   01-27-22 EXERCISE LOG  Exercise Repetitions and Resistance Comments  Nustep L4 x 16 minutes   Cybex knee flexion 40# x 3 minutes   Cybex knee extension 10# x 3 minutes   Leg press 2 plates x 3 minutes.       Gastroc stretch  Rocker board  X 3 mins PF/DF balance                  ASSESSMENT:  CLINICAL IMPRESSION: Patient pleased with his progress.  We discussed pain-free  there ex and avoid pain reproducing activities outside of PT.  He did great with the addition of the leg press today.  01/15/22 PROGRESS REPORT: Patient has made minimal progress with skilled physical therapy as evidenced by his functional mobility and progress toward his goals. However, this could partially be attributed to his fracture of the great toe on the left foot. Recommend that he continue with skilled physical therapy to address his remaining impairments to maximize his safety and functional mobility.   Jacqulynn Cadet, PT, DPT   OBJECTIVE IMPAIRMENTS Abnormal gait, decreased balance, decreased mobility, difficulty walking, decreased strength, and pain.   ACTIVITY LIMITATIONS stairs, transfers, and locomotion  level  PARTICIPATION LIMITATIONS: driving, community activity, occupation, and yard work  PERSONAL FACTORS Fitness, Time since onset of injury/illness/exacerbation, and 3+ comorbidities: HTN, DM, CKD  are also affecting patient's functional outcome.   REHAB POTENTIAL: Good  CLINICAL DECISION MAKING: Stable/uncomplicated  EVALUATION COMPLEXITY: Low   GOALS: Goals reviewed with patient? No  SHORT TERM GOALS: Target date: 12/24/2021  Patient will be independent with his initial HEP.  Baseline: Goal status: MET  2.  Patient will be able to transfer from sitting to standing with minimal upper extremity support.  Baseline:  Goal status: MET  3.  Patient will improve his TUG time to 15 seconds or less for improved functional mobility.  Baseline:  Goal status: MET      14 secs  LONG TERM GOALS: Target date: 01/14/2022   Patient will be independent with his advanced HEP.  Baseline:  Goal status: On Going  2.  Patient will improve his TUG time to 12 seconds or less for improved safety.  Baseline:  Goal status: On Going  3.  Patient will be able to complete his daily activities without his familiar knee pain exceeding 2/10.  Baseline:  Goal status: On going  PLAN: PT FREQUENCY: 2x/week  PT DURATION: 6 weeks  PLANNED INTERVENTIONS: Therapeutic exercises, Therapeutic activity, Neuromuscular re-education, Balance training, Gait training, Patient/Family education, Self Care, Joint mobilization, Stair training, Electrical stimulation, Cryotherapy, Moist heat, Taping, Vasopneumatic device, Manual therapy, and Re-evaluation  PLAN FOR NEXT SESSION: nustep, lower extremity strengthening and modalities as needed   Isabel Ardila, [place], PT 01/27/2022, 8:43 AM

## 2022-01-29 ENCOUNTER — Encounter: Payer: Self-pay | Admitting: *Deleted

## 2022-01-29 ENCOUNTER — Ambulatory Visit: Payer: Medicare PPO | Admitting: *Deleted

## 2022-01-29 DIAGNOSIS — M6281 Muscle weakness (generalized): Secondary | ICD-10-CM

## 2022-01-29 DIAGNOSIS — R2681 Unsteadiness on feet: Secondary | ICD-10-CM

## 2022-01-29 DIAGNOSIS — G8929 Other chronic pain: Secondary | ICD-10-CM

## 2022-01-29 NOTE — Therapy (Signed)
OUTPATIENT PHYSICAL THERAPY LOWER EXTREMITY TREATMENT   Patient Name: Kevin Jenkins MRN: [id number] DOB:Mar 14, 1940, 81 y.o., male Today's Date: 01/29/2022   PT End of Session - 01/29/22 0830     Visit Number 13    Number of Visits 18    Date for PT Re-Evaluation 03/13/22    PT Start Time 0826    PT Stop Time 0911    PT Time Calculation (min) 45 min               Past Medical History:  Diagnosis Date   Chronic kidney disease    Hx. of kidney stones   Diabetes mellitus without complication ([place]) [id number]   Hypertension    Past Surgical History:  Procedure Laterality Date   CYSTOSCOPY WITH RETROGRADE PYELOGRAM, URETEROSCOPY AND STENT PLACEMENT Right 05/19/2014   Procedure: CYSTOSCOPY WITH RETROGRADE PYELOGRAM, RIGHT URETEROSCOPY LASER  AND REMOVED JJ STENT, REPLACED [place];  Surgeon: Carolan Clines, MD;  Location: [facility];  Service: Urology;  Laterality: Right;   CYSTOSCOPY/RETROGRADE/URETEROSCOPY/STONE EXTRACTION WITH BASKET  2007   LITHOTRIPSY     LITHOTRIPSY     LITHOTRIPSY     Patient Active Problem List   Diagnosis Date Noted   Hyperkalemia 05/15/2014   Acute renal failure superimposed on stage 4 chronic kidney disease ([place]) 05/15/2014   Diabetes ([place]) 05/15/2014   Benign essential HTN 05/15/2014   Leukocytosis 05/15/2014   Right ureteral stone 26/33/3545   Metabolic acidosis    REFERRING PROVIDER: Lovett Calender, MD  REFERRING DIAG: Pain in right knee; Pain in left knee; Bilateral primary osteoarthritis of knee; Other symptoms and signs involving the musculoskeletal system  THERAPY DIAG:  Muscle weakness (generalized)  Unsteadiness on feet  Chronic pain of left knee  Chronic pain of right knee  Rationale for Evaluation and Treatment Rehabilitation  ONSET DATE: about 3 weeks ago  SUBJECTIVE:   SUBJECTIVE STATEMENT: Doing better.I can tell my legs are stronger. Running late.  PERTINENT HISTORY: HTN, DM, CKD  PAIN:  Are  you having pain? 3/10 LT knee,4/10 RT knee wear brace on RT  PRECAUTIONS: Fall  WEIGHT BEARING RESTRICTIONS No  FALLS:  Has patient fallen in last 6 months? Yes. Number of falls 2; the first fall was about 2 months ago when he stepped in a hole, 2nd fall was getting out of a tractor  LIVING ENVIRONMENT: Lives with: lives with their family Lives in: House/apartment Stairs: Yes: Internal: 15 steps; can reach both and External: 3-5 steps; on left going up; step to pattern with LLE leading Has following equipment at home: None  OCCUPATION: Turf grass management; primarily driving a truck or tractor  PLOF: Independent  PATIENT GOALS reduced pain and improved strength   OBJECTIVE:  Tandem: 7 seconds with LLE leading and 13 seconds with RLE leading   TODAY'S TREATMENT:                                   01-27-22 EXERCISE LOG  Exercise Repetitions and Resistance Comments  Nustep L4 x 15 minutes   Cybex knee flexion 40# x 3 minutes   Cybex knee extension 10# x 3 minutes Pain RT knee  Leg press    LAQ RT Knee 3# 3x10 pause at top   Gastroc stretch  Rocker board  X 5 mins PF/DF balance   Toe ups X20       no heel ups due  to fractured great toe RT   14in box lunge 2x10  each LE with quad control          ASSESSMENT:  CLINICAL IMPRESSION: Patient  arrived today feeling fairly well and reports doing better overall. Rx focused on Bil. LE strengthening [place] and CKC exs. RT knee extension is the most painful, but did well with LAQ's and 3# wt and able to reach end-range.      01/15/22 PROGRESS REPORT: Patient has made minimal progress with skilled physical therapy as evidenced by his functional mobility and progress toward his goals. However, this could partially be attributed to his fracture of the great toe on the left foot. Recommend that he continue with skilled physical therapy to address his remaining impairments to maximize his safety and functional mobility.   Jacqulynn Cadet,  PT, DPT   OBJECTIVE IMPAIRMENTS Abnormal gait, decreased balance, decreased mobility, difficulty walking, decreased strength, and pain.   ACTIVITY LIMITATIONS stairs, transfers, and locomotion level  PARTICIPATION LIMITATIONS: driving, community activity, occupation, and yard work  PERSONAL FACTORS Fitness, Time since onset of injury/illness/exacerbation, and 3+ comorbidities: HTN, DM, CKD  are also affecting patient's functional outcome.   REHAB POTENTIAL: Good  CLINICAL DECISION MAKING: Stable/uncomplicated  EVALUATION COMPLEXITY: Low   GOALS: Goals reviewed with patient? No  SHORT TERM GOALS: Target date: 12/24/2021  Patient will be independent with his initial HEP.  Baseline: Goal status: MET  2.  Patient will be able to transfer from sitting to standing with minimal upper extremity support.  Baseline:  Goal status: MET  3.  Patient will improve his TUG time to 15 seconds or less for improved functional mobility.  Baseline:  Goal status: MET      14 secs  LONG TERM GOALS: Target date: 01/14/2022   Patient will be independent with his advanced HEP.  Baseline:  Goal status: On Going  2.  Patient will improve his TUG time to 12 seconds or less for improved safety.  Baseline:  Goal status: On Going  3.  Patient will be able to complete his daily activities without his familiar knee pain exceeding 2/10.  Baseline:  Goal status: On going  PLAN: PT FREQUENCY: 2x/week  PT DURATION: 6 weeks  PLANNED INTERVENTIONS: Therapeutic exercises, Therapeutic activity, Neuromuscular re-education, Balance training, Gait training, Patient/Family education, Self Care, Joint mobilization, Stair training, Electrical stimulation, Cryotherapy, Moist heat, Taping, Vasopneumatic device, Manual therapy, and Re-evaluation  PLAN FOR NEXT SESSION: nustep, lower extremity strengthening and modalities as needed   [name],CHRIS, PTA 01/29/2022, 10:14 AM

## 2022-02-03 ENCOUNTER — Encounter: Payer: Self-pay | Admitting: *Deleted

## 2022-02-03 ENCOUNTER — Ambulatory Visit: Payer: Medicare PPO | Admitting: *Deleted

## 2022-02-03 DIAGNOSIS — G8929 Other chronic pain: Secondary | ICD-10-CM

## 2022-02-03 DIAGNOSIS — R2681 Unsteadiness on feet: Secondary | ICD-10-CM

## 2022-02-03 DIAGNOSIS — M6281 Muscle weakness (generalized): Secondary | ICD-10-CM | POA: Diagnosis not present

## 2022-02-03 NOTE — Therapy (Signed)
OUTPATIENT PHYSICAL THERAPY LOWER EXTREMITY TREATMENT   Patient Name: Kevin Jenkins MRN: [id number] DOB:02-18-1940, 81 y.o., male Today's Date: 02/03/2022   PT End of Session - 02/03/22 0839     Visit Number 14    Number of Visits 18    Date for PT Re-Evaluation 03/13/22    PT Start Time 0823               Past Medical History:  Diagnosis Date   Chronic kidney disease    Hx. of kidney stones   Diabetes mellitus without complication ([place]) [id number]   Hypertension    Past Surgical History:  Procedure Laterality Date   CYSTOSCOPY WITH RETROGRADE PYELOGRAM, URETEROSCOPY AND STENT PLACEMENT Right 05/19/2014   Procedure: CYSTOSCOPY WITH RETROGRADE PYELOGRAM, RIGHT URETEROSCOPY LASER  AND REMOVED JJ STENT, REPLACED [place];  Surgeon: Carolan Clines, MD;  Location: [facility];  Service: Urology;  Laterality: Right;   CYSTOSCOPY/RETROGRADE/URETEROSCOPY/STONE EXTRACTION WITH BASKET  2007   LITHOTRIPSY     LITHOTRIPSY     LITHOTRIPSY     Patient Active Problem List   Diagnosis Date Noted   Hyperkalemia 05/15/2014   Acute renal failure superimposed on stage 4 chronic kidney disease ([place]) 05/15/2014   Diabetes ([place]) 05/15/2014   Benign essential HTN 05/15/2014   Leukocytosis 05/15/2014   Right ureteral stone 18/56/3149   Metabolic acidosis    REFERRING PROVIDER: Lovett Calender, MD  REFERRING DIAG: Pain in right knee; Pain in left knee; Bilateral primary osteoarthritis of knee; Other symptoms and signs involving the musculoskeletal system  THERAPY DIAG:  Muscle weakness (generalized)  Unsteadiness on feet  Chronic pain of left knee  Chronic pain of right knee  Rationale for Evaluation and Treatment Rehabilitation  ONSET DATE: about 3 weeks ago  SUBJECTIVE:   SUBJECTIVE STATEMENT: Doing better.I can tell my legs are stronger during ADL's. RT  PERTINENT HISTORY: HTN, DM, CKD  PAIN:  Are you having pain? 3/10 LT knee,4/10 RT knee wear brace on  RT  PRECAUTIONS: Fall  WEIGHT BEARING RESTRICTIONS No  FALLS:  Has patient fallen in last 6 months? Yes. Number of falls 2; the first fall was about 2 months ago when he stepped in a hole, 2nd fall was getting out of a tractor  LIVING ENVIRONMENT: Lives with: lives with their family Lives in: House/apartment Stairs: Yes: Internal: 15 steps; can reach both and External: 3-5 steps; on left going up; step to pattern with LLE leading Has following equipment at home: None  OCCUPATION: Turf grass management; primarily driving a truck or tractor  PLOF: Independent  PATIENT GOALS reduced pain and improved strength   OBJECTIVE:  Tandem: 7 seconds with LLE leading and 13 seconds with RLE leading   TODAY'S TREATMENT:                                   02-03-22 EXERCISE LOG  Exercise Repetitions and Resistance Comments  Nustep L4 x 18 minutes 1000 steps  Cybex knee flexion 40# x 3 minutes   Cybex knee extension 10# x 3 minutes Pain RT knee  Leg press    LAQ RT Knee 3# 3x10 pause at top   Gastroc stretch  Rocker board  X 5 mins PF/DF balance   Toe ups    14in box lunge 2x10  each LE with quad control          ASSESSMENT:  CLINICAL  IMPRESSION: Patient  arrived today feeling fairly well and reports doing better overall with LE strength. Rx focused on Bil. LE strengthening OKC and CKC exs. RT knee extension is the most painful, but did well with LAQ's and 3# wt again and able to pause at top without pain.  TUG test next visit.      01/15/22 PROGRESS REPORT: Patient has made minimal progress with skilled physical therapy as evidenced by his functional mobility and progress toward his goals. However, this could partially be attributed to his fracture of the great toe on the left foot. Recommend that he continue with skilled physical therapy to address his remaining impairments to maximize his safety and functional mobility.   Jacqulynn Cadet, PT, DPT   OBJECTIVE IMPAIRMENTS Abnormal  gait, decreased balance, decreased mobility, difficulty walking, decreased strength, and pain.   ACTIVITY LIMITATIONS stairs, transfers, and locomotion level  PARTICIPATION LIMITATIONS: driving, community activity, occupation, and yard work  PERSONAL FACTORS Fitness, Time since onset of injury/illness/exacerbation, and 3+ comorbidities: HTN, DM, CKD  are also affecting patient's functional outcome.   REHAB POTENTIAL: Good  CLINICAL DECISION MAKING: Stable/uncomplicated  EVALUATION COMPLEXITY: Low   GOALS: Goals reviewed with patient? No  SHORT TERM GOALS: Target date: 12/24/2021  Patient will be independent with his initial HEP.  Baseline: Goal status: MET  2.  Patient will be able to transfer from sitting to standing with minimal upper extremity support.  Baseline:  Goal status: MET  3.  Patient will improve his TUG time to 15 seconds or less for improved functional mobility.  Baseline:  Goal status: MET      14 secs  LONG TERM GOALS: Target date: 01/14/2022   Patient will be independent with his advanced HEP.  Baseline:  Goal status: On Going  2.  Patient will improve his TUG time to 12 seconds or less for improved safety.  Baseline:  Goal status: On Going  3.  Patient will be able to complete his daily activities without his familiar knee pain exceeding 2/10.  Baseline:  Goal status: On going  PLAN: PT FREQUENCY: 2x/week  PT DURATION: 6 weeks  PLANNED INTERVENTIONS: Therapeutic exercises, Therapeutic activity, Neuromuscular re-education, Balance training, Gait training, Patient/Family education, Self Care, Joint mobilization, Stair training, Electrical stimulation, Cryotherapy, Moist heat, Taping, Vasopneumatic device, Manual therapy, and Re-evaluation  PLAN FOR NEXT SESSION: nustep, lower extremity strengthening and modalities as needed   Odette Watanabe,CHRIS, PTA 02/03/2022, 9:21 AM

## 2022-02-05 ENCOUNTER — Encounter: Payer: Self-pay | Admitting: Physical Therapy

## 2022-02-05 ENCOUNTER — Ambulatory Visit: Payer: Medicare PPO | Admitting: Physical Therapy

## 2022-02-05 DIAGNOSIS — M6281 Muscle weakness (generalized): Secondary | ICD-10-CM

## 2022-02-05 DIAGNOSIS — R2681 Unsteadiness on feet: Secondary | ICD-10-CM

## 2022-02-05 NOTE — Therapy (Signed)
OUTPATIENT PHYSICAL THERAPY LOWER EXTREMITY TREATMENT   Patient Name: Kevin Jenkins MRN: [id number] DOB:12/25/1940, 81 y.o., male Today's Date: 02/05/2022   PT End of Session - 02/05/22 0826     Visit Number 15    Number of Visits 18    Date for PT Re-Evaluation 03/13/22    PT Start Time 0825    PT Stop Time 0912    PT Time Calculation (min) 47 min    Activity Tolerance Patient tolerated treatment well    Behavior During Therapy [facility] for tasks assessed/performed            Past Medical History:  Diagnosis Date   Chronic kidney disease    Hx. of kidney stones   Diabetes mellitus without complication ([place]) [id number]   Hypertension    Past Surgical History:  Procedure Laterality Date   CYSTOSCOPY WITH RETROGRADE PYELOGRAM, URETEROSCOPY AND STENT PLACEMENT Right 05/19/2014   Procedure: CYSTOSCOPY WITH RETROGRADE PYELOGRAM, RIGHT URETEROSCOPY LASER  AND REMOVED JJ STENT, REPLACED [place];  Surgeon: Carolan Clines, MD;  Location: [facility];  Service: Urology;  Laterality: Right;   CYSTOSCOPY/RETROGRADE/URETEROSCOPY/STONE EXTRACTION WITH BASKET  2007   LITHOTRIPSY     LITHOTRIPSY     LITHOTRIPSY     Patient Active Problem List   Diagnosis Date Noted   Hyperkalemia 05/15/2014   Acute renal failure superimposed on stage 4 chronic kidney disease ([place]) 05/15/2014   Diabetes ([place]) 05/15/2014   Benign essential HTN 05/15/2014   Leukocytosis 05/15/2014   Right ureteral stone 73/41/9379   Metabolic acidosis    REFERRING PROVIDER: Lovett Calender, MD  REFERRING DIAG: Pain in right knee; Pain in left knee; Bilateral primary osteoarthritis of knee; Other symptoms and signs involving the musculoskeletal system  THERAPY DIAG:  Muscle weakness (generalized)  Unsteadiness on feet  Rationale for Evaluation and Treatment Rehabilitation  ONSET DATE: about 3 weeks ago  SUBJECTIVE:   SUBJECTIVE STATEMENT: Reports his knees doing pretty good . Went to an auction  yesterday and was standing pretty much all day and did good with not really much pain.  PERTINENT HISTORY: HTN, DM, CKD  PAIN: None  PRECAUTIONS: Fall  PATIENT GOALS reduced pain and improved strength  OBJECTIVE:   TODAY'S TREATMENT:                                   02-05-22 EXERCISE LOG  Exercise Repetitions and Resistance Comments  Nustep L4 x 19 minutes 1000 steps  Cybex knee flexion 40# x 3 minutes   Cybex knee extension 10# x 3 minutes Pain RT knee  Heel raises X20 reps   Toe raises X20 reps   Hip abduction With DF x30 reps BLE   TUG 10 sec    ASSESSMENT:  CLINICAL IMPRESSION: Patient presented in clinic with no current knee pain or reported weakness. Patient stood or was driving a lot yesterday and had no complaints. Sit <> stands are still fairly difficult as observed during treatment. Treatment predominately focused on quad activation and strengthening. No complaints following end of session and met TUG goals.  OBJECTIVE IMPAIRMENTS Abnormal gait, decreased balance, decreased mobility, difficulty walking, decreased strength, and pain.   ACTIVITY LIMITATIONS stairs, transfers, and locomotion level  PARTICIPATION LIMITATIONS: driving, community activity, occupation, and yard work  PERSONAL FACTORS Fitness, Time since onset of injury/illness/exacerbation, and 3+ comorbidities: HTN, DM, CKD  are also affecting patient's functional outcome.  REHAB POTENTIAL: Good  CLINICAL DECISION MAKING: Stable/uncomplicated  EVALUATION COMPLEXITY: Low  GOALS: Goals reviewed with patient? No  SHORT TERM GOALS: Target date: 12/24/2021  Patient will be independent with his initial HEP.  Baseline: Goal status: MET  2.  Patient will be able to transfer from sitting to standing with minimal upper extremity support.  Baseline:  Goal status: MET  3.  Patient will improve his TUG time to 15 seconds or less for improved functional mobility.  Baseline:  Goal status: MET      14  secs  LONG TERM GOALS: Target date: 01/14/2022   Patient will be independent with his advanced HEP.  Baseline:  Goal status: On Going  2.  Patient will improve his TUG time to 12 seconds or less for improved safety.  Baseline:  Goal status: MET  3.  Patient will be able to complete his daily activities without his familiar knee pain exceeding 2/10.  Baseline:  Goal status: On going  PLAN: PT FREQUENCY: 2x/week  PT DURATION: 6 weeks  PLANNED INTERVENTIONS: Therapeutic exercises, Therapeutic activity, Neuromuscular re-education, Balance training, Gait training, Patient/Family education, Self Care, Joint mobilization, Stair training, Electrical stimulation, Cryotherapy, Moist heat, Taping, Vasopneumatic device, Manual therapy, and Re-evaluation  PLAN FOR NEXT SESSION: nustep, lower extremity strengthening and modalities as needed  Standley Brooking, PTA 02/05/2022, 9:42 AM

## 2022-02-12 ENCOUNTER — Ambulatory Visit: Payer: Medicare PPO

## 2022-02-12 DIAGNOSIS — M6281 Muscle weakness (generalized): Secondary | ICD-10-CM

## 2022-02-12 DIAGNOSIS — R2681 Unsteadiness on feet: Secondary | ICD-10-CM

## 2022-02-12 DIAGNOSIS — G8929 Other chronic pain: Secondary | ICD-10-CM

## 2022-02-12 NOTE — Therapy (Signed)
OUTPATIENT PHYSICAL THERAPY LOWER EXTREMITY TREATMENT   Patient Name: Kevin Jenkins MRN: [id number] DOB:1941/01/26, 81 y.o., male Today's Date: 02/12/2022   PT End of Session - 02/12/22 0826     Visit Number 16    Number of Visits 18    Date for PT Re-Evaluation 03/13/22    PT Start Time 0825   Patient arrived late to his appointment   PT Stop Time 0900    PT Time Calculation (min) 35 min    Activity Tolerance Patient tolerated treatment well    Behavior During Therapy [facility] for tasks assessed/performed            Past Medical History:  Diagnosis Date   Chronic kidney disease    Hx. of kidney stones   Diabetes mellitus without complication ([place]) [id number]   Hypertension    Past Surgical History:  Procedure Laterality Date   CYSTOSCOPY WITH RETROGRADE PYELOGRAM, URETEROSCOPY AND STENT PLACEMENT Right 05/19/2014   Procedure: CYSTOSCOPY WITH RETROGRADE PYELOGRAM, RIGHT URETEROSCOPY LASER  AND REMOVED JJ STENT, REPLACED [place];  Surgeon: Carolan Clines, MD;  Location: [facility];  Service: Urology;  Laterality: Right;   CYSTOSCOPY/RETROGRADE/URETEROSCOPY/STONE EXTRACTION WITH BASKET  2007   LITHOTRIPSY     LITHOTRIPSY     LITHOTRIPSY     Patient Active Problem List   Diagnosis Date Noted   Hyperkalemia 05/15/2014   Acute renal failure superimposed on stage 4 chronic kidney disease ([place]) 05/15/2014   Diabetes ([place]) 05/15/2014   Benign essential HTN 05/15/2014   Leukocytosis 05/15/2014   Right ureteral stone 82/99/3716   Metabolic acidosis    REFERRING PROVIDER: Lovett Calender, MD  REFERRING DIAG: Pain in right knee; Pain in left knee; Bilateral primary osteoarthritis of knee; Other symptoms and signs involving the musculoskeletal system  THERAPY DIAG:  Muscle weakness (generalized)  Unsteadiness on feet  Chronic pain of left knee  Chronic pain of right knee  Rationale for Evaluation and Treatment Rehabilitation  ONSET DATE: about 3 weeks  ago  SUBJECTIVE:   SUBJECTIVE STATEMENT: Patient reports that he feels like he is getting better. He is still having problems with his toe, but "that is to be expected."   PERTINENT HISTORY: HTN, DM, CKD  PAIN: None  PRECAUTIONS: Fall  PATIENT GOALS reduced pain and improved strength  OBJECTIVE:   TODAY'S TREATMENT:                                   12/28 EXERCISE LOG  Exercise Repetitions and Resistance Comments  Nustep L5 x 16 minutes   Cybex knee extension  10# x 3 minutes   Cybex knee flexion  50# x 3 minutes   Rocker board  4 minutes   Cybex leg press 2 plates; seat 8 x 3 minutes    Blank cell = exercise not performed today                                    02-05-22 EXERCISE LOG  Exercise Repetitions and Resistance Comments  Nustep L4 x 19 minutes 1000 steps  Cybex knee flexion 40# x 3 minutes   Cybex knee extension 10# x 3 minutes Pain RT knee  Heel raises X20 reps   Toe raises X20 reps   Hip abduction With DF x30 reps BLE   TUG 10 sec  ASSESSMENT:  CLINICAL IMPRESSION: Patient was progressed with weighted leg press for improved function with sit to stands. He experienced the most difficulty with weighted knee extension as evidenced by his reduced arc of motion as he neared the end of this activity. He reported no significant pain or discomfort with any of today's interventions. He reported that his legs felt tired upon the conclusion of treatment. He continues to require skilled physical therapy to address his remaining impairments to maximize his functional mobility.   OBJECTIVE IMPAIRMENTS Abnormal gait, decreased balance, decreased mobility, difficulty walking, decreased strength, and pain.   ACTIVITY LIMITATIONS stairs, transfers, and locomotion level  PARTICIPATION LIMITATIONS: driving, community activity, occupation, and yard work  PERSONAL FACTORS Fitness, Time since onset of injury/illness/exacerbation, and 3+ comorbidities: HTN, DM, CKD  are also  affecting patient's functional outcome.   REHAB POTENTIAL: Good  CLINICAL DECISION MAKING: Stable/uncomplicated  EVALUATION COMPLEXITY: Low  GOALS: Goals reviewed with patient? No  SHORT TERM GOALS: Target date: 12/24/2021  Patient will be independent with his initial HEP.  Baseline: Goal status: MET  2.  Patient will be able to transfer from sitting to standing with minimal upper extremity support.  Baseline:  Goal status: MET  3.  Patient will improve his TUG time to 15 seconds or less for improved functional mobility.  Baseline:  Goal status: MET      14 secs  LONG TERM GOALS: Target date: 01/14/2022   Patient will be independent with his advanced HEP.  Baseline:  Goal status: On Going  2.  Patient will improve his TUG time to 12 seconds or less for improved safety.  Baseline:  Goal status: MET  3.  Patient will be able to complete his daily activities without his familiar knee pain exceeding 2/10.  Baseline:  Goal status: On going  PLAN: PT FREQUENCY: 2x/week  PT DURATION: 6 weeks  PLANNED INTERVENTIONS: Therapeutic exercises, Therapeutic activity, Neuromuscular re-education, Balance training, Gait training, Patient/Family education, Self Care, Joint mobilization, Stair training, Electrical stimulation, Cryotherapy, Moist heat, Taping, Vasopneumatic device, Manual therapy, and Re-evaluation  PLAN FOR NEXT SESSION: nustep, lower extremity strengthening and modalities as needed  Darlin Coco, PT 02/12/2022, 9:09 AM

## 2022-02-17 ENCOUNTER — Encounter: Payer: Self-pay | Admitting: *Deleted

## 2022-02-17 ENCOUNTER — Ambulatory Visit: Payer: Medicare PPO | Attending: Family Medicine | Admitting: *Deleted

## 2022-02-17 DIAGNOSIS — M6281 Muscle weakness (generalized): Secondary | ICD-10-CM | POA: Diagnosis not present

## 2022-02-17 DIAGNOSIS — M25562 Pain in left knee: Secondary | ICD-10-CM | POA: Insufficient documentation

## 2022-02-17 DIAGNOSIS — G8929 Other chronic pain: Secondary | ICD-10-CM | POA: Diagnosis present

## 2022-02-17 DIAGNOSIS — R2681 Unsteadiness on feet: Secondary | ICD-10-CM

## 2022-02-17 DIAGNOSIS — M25561 Pain in right knee: Secondary | ICD-10-CM | POA: Diagnosis present

## 2022-02-17 NOTE — Therapy (Signed)
OUTPATIENT PHYSICAL THERAPY LOWER EXTREMITY TREATMENT   Patient Name: Kevin Jenkins MRN: [id number] DOB:[birth year], 82 y.o., male Today's Date: 02/17/2022   PT End of Session - 02/17/22 0828     Visit Number 17    Number of Visits 18    Date for PT Re-Evaluation 03/13/22    PT Start Time 0825    PT Stop Time 0903    PT Time Calculation (min) 38 min            Past Medical History:  Diagnosis Date   Chronic kidney disease    Hx. of kidney stones   Diabetes mellitus without complication ([place]) [id number]   Hypertension    Past Surgical History:  Procedure Laterality Date   CYSTOSCOPY WITH RETROGRADE PYELOGRAM, URETEROSCOPY AND STENT PLACEMENT Right 05/19/2014   Procedure: CYSTOSCOPY WITH RETROGRADE PYELOGRAM, RIGHT URETEROSCOPY LASER  AND REMOVED JJ STENT, REPLACED [place];  Surgeon: Carolan Clines, MD;  Location: [facility];  Service: Urology;  Laterality: Right;   CYSTOSCOPY/RETROGRADE/URETEROSCOPY/STONE EXTRACTION WITH BASKET  2007   LITHOTRIPSY     LITHOTRIPSY     LITHOTRIPSY     Patient Active Problem List   Diagnosis Date Noted   Hyperkalemia 05/15/2014   Acute renal failure superimposed on stage 4 chronic kidney disease ([place]) 05/15/2014   Diabetes ([place]) 05/15/2014   Benign essential HTN 05/15/2014   Leukocytosis 05/15/2014   Right ureteral stone 86/76/7209   Metabolic acidosis    REFERRING PROVIDER: Lovett Calender, MD  REFERRING DIAG: Pain in right knee; Pain in left knee; Bilateral primary osteoarthritis of knee; Other symptoms and signs involving the musculoskeletal system  THERAPY DIAG:  Muscle weakness (generalized)  Unsteadiness on feet  Chronic pain of left knee  Chronic pain of right knee  Rationale for Evaluation and Treatment Rehabilitation  ONSET DATE: about 3 weeks ago  SUBJECTIVE:   SUBJECTIVE STATEMENT: Patient reports that he feels like he is getting better. He is still having problems with his toe, but "that is to be  expected."   PERTINENT HISTORY: HTN, DM, CKD  PAIN: None  PRECAUTIONS: Fall  PATIENT GOALS reduced pain and improved strength  OBJECTIVE:   TODAY'S TREATMENT:                                   02-17-22           EXERCISE LOG  Exercise Repetitions and Resistance Comments  Nustep L5 x 16 minutes   Cybex knee extension  10# x 4 minutes   Cybex knee flexion  50# x 4 minutes   Rocker board  5 minutes   Cybex leg press    14 in box Lunges x 20 each LE  with focus on Quad control            Blank cell = exercise not performed today                                      02-05-22 EXERCISE LOG  Exercise Repetitions and Resistance Comments  Nustep L4 x 19 minutes 1000 steps  Cybex knee flexion 40# x 3 minutes   Cybex knee extension 10# x 3 minutes Pain RT knee  Heel raises X20 reps   Toe raises X20 reps   Hip abduction With DF x30 reps BLE   TUG  10 sec    ASSESSMENT:  CLINICAL IMPRESSION: Pt arrived today doing  fairly well and reports doing better over all. Rx focused on LE strengthening, ROM, as well as balance. DC after next visit.  OBJECTIVE IMPAIRMENTS Abnormal gait, decreased balance, decreased mobility, difficulty walking, decreased strength, and pain.   ACTIVITY LIMITATIONS stairs, transfers, and locomotion level  PARTICIPATION LIMITATIONS: driving, community activity, occupation, and yard work  PERSONAL FACTORS Fitness, Time since onset of injury/illness/exacerbation, and 3+ comorbidities: HTN, DM, CKD  are also affecting patient's functional outcome.   REHAB POTENTIAL: Good  CLINICAL DECISION MAKING: Stable/uncomplicated  EVALUATION COMPLEXITY: Low  GOALS: Goals reviewed with patient? No  SHORT TERM GOALS: Target date: 12/24/2021  Patient will be independent with his initial HEP.  Baseline: Goal status: MET  2.  Patient will be able to transfer from sitting to standing with minimal upper extremity support.  Baseline:  Goal status: MET  3.  Patient  will improve his TUG time to 15 seconds or less for improved functional mobility.  Baseline:  Goal status: MET      14 secs  LONG TERM GOALS: Target date: 01/14/2022   Patient will be independent with his advanced HEP.  Baseline:  Goal status: On Going  2.  Patient will improve his TUG time to 12 seconds or less for improved safety.  Baseline:  Goal status: MET  3.  Patient will be able to complete his daily activities without his familiar knee pain exceeding 2/10.  Baseline:  Goal status: On going  PLAN: PT FREQUENCY: 2x/week  PT DURATION: 6 weeks  PLANNED INTERVENTIONS: Therapeutic exercises, Therapeutic activity, Neuromuscular re-education, Balance training, Gait training, Patient/Family education, Self Care, Joint mobilization, Stair training, Electrical stimulation, Cryotherapy, Moist heat, Taping, Vasopneumatic device, Manual therapy, and Re-evaluation  PLAN FOR NEXT SESSION: nustep, lower extremity strengthening and modalities as needed  Gionni Freese,CHRIS, PTA 02/17/2022, 9:04 AM

## 2022-02-19 ENCOUNTER — Encounter: Payer: Self-pay | Admitting: Physical Therapy

## 2022-02-19 ENCOUNTER — Ambulatory Visit: Payer: Medicare PPO | Admitting: Physical Therapy

## 2022-02-19 DIAGNOSIS — M6281 Muscle weakness (generalized): Secondary | ICD-10-CM

## 2022-02-19 DIAGNOSIS — G8929 Other chronic pain: Secondary | ICD-10-CM

## 2022-02-19 DIAGNOSIS — R2681 Unsteadiness on feet: Secondary | ICD-10-CM

## 2022-02-19 NOTE — Therapy (Incomplete Revision)
OUTPATIENT PHYSICAL THERAPY LOWER EXTREMITY TREATMENT   Patient Name: Kevin Jenkins MRN: [id number] DOB:Jan 20, 1941, 82 y.o., male Today's Date: 02/19/2022   PT End of Session - 02/19/22 0844     Visit Number 18    Number of Visits 18    Date for PT Re-Evaluation 03/13/22    PT Start Time 0839    PT Stop Time 0903    PT Time Calculation (min) 24 min    Activity Tolerance Patient tolerated treatment well    Behavior During Therapy [facility] for tasks assessed/performed            Past Medical History:  Diagnosis Date   Chronic kidney disease    Hx. of kidney stones   Diabetes mellitus without complication ([place]) [id number]   Hypertension    Past Surgical History:  Procedure Laterality Date   CYSTOSCOPY WITH RETROGRADE PYELOGRAM, URETEROSCOPY AND STENT PLACEMENT Right 05/19/2014   Procedure: CYSTOSCOPY WITH RETROGRADE PYELOGRAM, RIGHT URETEROSCOPY LASER  AND REMOVED JJ STENT, REPLACED [place];  Surgeon: Carolan Clines, MD;  Location: [facility];  Service: Urology;  Laterality: Right;   CYSTOSCOPY/RETROGRADE/URETEROSCOPY/STONE EXTRACTION WITH BASKET  2007   LITHOTRIPSY     LITHOTRIPSY     LITHOTRIPSY     Patient Active Problem List   Diagnosis Date Noted   Hyperkalemia 05/15/2014   Acute renal failure superimposed on stage 4 chronic kidney disease ([place]) 05/15/2014   Diabetes ([place]) 05/15/2014   Benign essential HTN 05/15/2014   Leukocytosis 05/15/2014   Right ureteral stone 79/03/4095   Metabolic acidosis    REFERRING PROVIDER: Lovett Calender, MD  REFERRING DIAG: Pain in right knee; Pain in left knee; Bilateral primary osteoarthritis of knee; Other symptoms and signs involving the musculoskeletal system  THERAPY DIAG:  Muscle weakness (generalized)  Unsteadiness on feet  Chronic pain of left knee  Chronic pain of right knee  Rationale for Evaluation and Treatment Rehabilitation  ONSET DATE: about 3 weeks ago  SUBJECTIVE:   SUBJECTIVE  STATEMENT: Patient reports that he's not 100% but can tell his legs are stronger.  PERTINENT HISTORY: HTN, DM, CKD  PAIN: None  PRECAUTIONS: Fall  PATIENT GOALS reduced pain and improved strength  OBJECTIVE:   TODAY'S TREATMENT:                                   02-19-22           EXERCISE LOG  Exercise Repetitions and Resistance Comments  Nustep L4 x 15 minutes   Cybex knee extension  20# x 4 minutes   Cybex knee flexion  40# x 4 minutes    Blank cell = exercise not performed today   ASSESSMENT:  CLINICAL IMPRESSION: Patient presented in clinic with reports of improvement overall with knee pain. Patient seeing improvement overall with knee strength as well. Met most goals set at evaluation. Limited treatment today due to late arrival and request to leave early. Patient also educated of local gym facility for continued strengthening.  PHYSICAL THERAPY DISCHARGE SUMMARY  Visits from Start of Care: 18  Current functional level related to goals / functional outcomes: Patient was able to meet most of his goals for skilled physical therapy and felt comfortable being discharged with his HEP at this time.    Remaining deficits: ***   Education / Equipment: ***   Patient agrees to discharge. Patient goals were {OP Goals:25702::"met"}. Patient is being  discharged due to {OP Discharge Reasons:25703::"meeting the stated rehab goals."}   OBJECTIVE IMPAIRMENTS Abnormal gait, decreased balance, decreased mobility, difficulty walking, decreased strength, and pain.   ACTIVITY LIMITATIONS stairs, transfers, and locomotion level  PARTICIPATION LIMITATIONS: driving, community activity, occupation, and yard work  PERSONAL FACTORS Fitness, Time since onset of injury/illness/exacerbation, and 3+ comorbidities: HTN, DM, CKD  are also affecting patient's functional outcome.   REHAB POTENTIAL: Good  CLINICAL DECISION MAKING: Stable/uncomplicated  EVALUATION COMPLEXITY:  Low  GOALS: Goals reviewed with patient? No  SHORT TERM GOALS: Target date: 12/24/2021  Patient will be independent with his initial HEP.  Baseline: Goal status: MET  2.  Patient will be able to transfer from sitting to standing with minimal upper extremity support.  Baseline:  Goal status: MET  3.  Patient will improve his TUG time to 15 seconds or less for improved functional mobility.  Baseline:  Goal status: MET      14 secs  LONG TERM GOALS: Target date: 01/14/2022   Patient will be independent with his advanced HEP.  Baseline:  Goal status: UNABLE TO ASSESS  2.  Patient will improve his TUG time to 12 seconds or less for improved safety.  Baseline:  Goal status: MET  3.  Patient will be able to complete his daily activities without his familiar knee pain exceeding 2/10.  Baseline:  Goal status: MET  PLAN: PT FREQUENCY: 2x/week  PT DURATION: 6 weeks  PLANNED INTERVENTIONS: Therapeutic exercises, Therapeutic activity, Neuromuscular re-education, Balance training, Gait training, Patient/Family education, Self Care, Joint mobilization, Stair training, Electrical stimulation, Cryotherapy, Moist heat, Taping, Vasopneumatic device, Manual therapy, and Re-evaluation  PLAN FOR NEXT SESSION: DC  Standley Brooking, PTA 02/19/2022, 9:29 AM

## 2022-02-19 NOTE — Therapy (Addendum)
OUTPATIENT PHYSICAL THERAPY LOWER EXTREMITY TREATMENT   Patient Name: Kevin Jenkins MRN: [id number] DOB:04/18/1940, 82 y.o., male Today's Date: 02/19/2022   PT End of Session - 02/19/22 0844     Visit Number 18    Number of Visits 18    Date for PT Re-Evaluation 03/13/22    PT Start Time 0839    PT Stop Time 0903    PT Time Calculation (min) 24 min    Activity Tolerance Patient tolerated treatment well    Behavior During Therapy [facility] for tasks assessed/performed            Past Medical History:  Diagnosis Date   Chronic kidney disease    Hx. of kidney stones   Diabetes mellitus without complication ([place]) [id number]   Hypertension    Past Surgical History:  Procedure Laterality Date   CYSTOSCOPY WITH RETROGRADE PYELOGRAM, URETEROSCOPY AND STENT PLACEMENT Right 05/19/2014   Procedure: CYSTOSCOPY WITH RETROGRADE PYELOGRAM, RIGHT URETEROSCOPY LASER  AND REMOVED JJ STENT, REPLACED [place];  Surgeon: Carolan Clines, MD;  Location[facility];  Service: Urology;  Laterality: Right;   CYSTOSCOPY/RETROGRADE/URETEROSCOPY/STONE EXTRACTION WITH BASKET  2007   LITHOTRIPSY     LITHOTRIPSY     LITHOTRIPSY     Patient Active Problem List   Diagnosis Date Noted   Hyperkalemia 05/15/2014   Acute renal failure superimposed on stage 4 chronic kidney disease ([place]) 05/15/2014   Diabetes ([place]) 05/15/2014   Benign essential HTN 05/15/2014   Leukocytosis 05/15/2014   Right ureteral stone 74/09/1446   Metabolic acidosis    REFERRING PROVIDER: Lovett Calender, MD  REFERRING DIAG: Pain in right knee; Pain in left knee; Bilateral primary osteoarthritis of knee; Other symptoms and signs involving the musculoskeletal system  THERAPY DIAG:  Muscle weakness (generalized)  Unsteadiness on feet  Chronic pain of left knee  Chronic pain of right knee  Rationale for Evaluation and Treatment Rehabilitation  ONSET DATE: about 3 weeks ago  SUBJECTIVE:   SUBJECTIVE  STATEMENT: Patient reports that he's not 100% but can tell his legs are stronger.  PERTINENT HISTORY: HTN, DM, CKD  PAIN: None  PRECAUTIONS: Fall  PATIENT GOALS reduced pain and improved strength  OBJECTIVE:   TODAY'S TREATMENT:                                   02-19-22           EXERCISE LOG  Exercise Repetitions and Resistance Comments  Nustep L4 x 15 minutes   Cybex knee extension  20# x 4 minutes   Cybex knee flexion  40# x 4 minutes    Blank cell = exercise not performed today   ASSESSMENT:  CLINICAL IMPRESSION: Patient presented in clinic with reports of improvement overall with knee pain. Patient seeing improvement overall with knee strength as well. Met most goals set at evaluation. Limited treatment today due to late arrival and request to leave early. Patient also educated of local gym facility for continued strengthening.  PHYSICAL THERAPY DISCHARGE SUMMARY  Visits from Start of Care: 18  Current functional level related to goals / functional outcomes: Patient was able to meet most of his goals for skilled physical therapy and felt comfortable being discharged with his HEP at this time.    Remaining deficits: Independence with his HEP    Education / Equipment: HEP    Patient agrees to discharge. Patient goals were  partially met. Patient is being discharged due to being pleased with the current functional level.  Jacqulynn Cadet, PT, DPT    OBJECTIVE IMPAIRMENTS Abnormal gait, decreased balance, decreased mobility, difficulty walking, decreased strength, and pain.   ACTIVITY LIMITATIONS stairs, transfers, and locomotion level  PARTICIPATION LIMITATIONS: driving, community activity, occupation, and yard work  PERSONAL FACTORS Fitness, Time since onset of injury/illness/exacerbation, and 3+ comorbidities: HTN, DM, CKD  are also affecting patient's functional outcome.   REHAB POTENTIAL: Good  CLINICAL DECISION MAKING: Stable/uncomplicated  EVALUATION  COMPLEXITY: Low  GOALS: Goals reviewed with patient? No  SHORT TERM GOALS: Target date: 12/24/2021  Patient will be independent with his initial HEP.  Baseline: Goal status: MET  2.  Patient will be able to transfer from sitting to standing with minimal upper extremity support.  Baseline:  Goal status: MET  3.  Patient will improve his TUG time to 15 seconds or less for improved functional mobility.  Baseline:  Goal status: MET      14 secs  LONG TERM GOALS: Target date: 01/14/2022   Patient will be independent with his advanced HEP.  Baseline:  Goal status: UNABLE TO ASSESS  2.  Patient will improve his TUG time to 12 seconds or less for improved safety.  Baseline:  Goal status: MET  3.  Patient will be able to complete his daily activities without his familiar knee pain exceeding 2/10.  Baseline:  Goal status: MET  PLAN: PT FREQUENCY: 2x/week  PT DURATION: 6 weeks  PLANNED INTERVENTIONS: Therapeutic exercises, Therapeutic activity, Neuromuscular re-education, Balance training, Gait training, Patient/Family education, Self Care, Joint mobilization, Stair training, Electrical stimulation, Cryotherapy, Moist heat, Taping, Vasopneumatic device, Manual therapy, and Re-evaluation  PLAN FOR NEXT SESSION: DC  Standley Brooking, PTA 02/19/2022, 9:29 AM

## 2023-05-19 ENCOUNTER — Encounter: Payer: Self-pay | Admitting: Nephrology

## 2023-05-19 ENCOUNTER — Other Ambulatory Visit: Payer: Self-pay | Admitting: Nephrology

## 2023-05-19 DIAGNOSIS — N1832 Chronic kidney disease, stage 3b: Secondary | ICD-10-CM

## 2023-05-20 ENCOUNTER — Ambulatory Visit
Admission: RE | Admit: 2023-05-20 | Discharge: 2023-05-20 | Disposition: A | Discharge status: Home / Self Care | Source: Ambulatory Visit | Attending: Nephrology | Admitting: Nephrology

## 2023-05-20 DIAGNOSIS — N1832 Chronic kidney disease, stage 3b: Secondary | ICD-10-CM

## 2023-12-27 ENCOUNTER — Other Ambulatory Visit: Payer: Self-pay

## 2023-12-27 ENCOUNTER — Encounter: Payer: Self-pay | Admitting: Physical Therapy

## 2023-12-27 ENCOUNTER — Ambulatory Visit: Attending: Surgical | Admitting: Physical Therapy

## 2023-12-27 DIAGNOSIS — M6281 Muscle weakness (generalized): Secondary | ICD-10-CM | POA: Diagnosis present

## 2023-12-27 DIAGNOSIS — M25511 Pain in right shoulder: Secondary | ICD-10-CM | POA: Insufficient documentation

## 2023-12-27 DIAGNOSIS — M25611 Stiffness of right shoulder, not elsewhere classified: Secondary | ICD-10-CM | POA: Diagnosis present

## 2023-12-27 NOTE — Therapy (Signed)
 OUTPATIENT PHYSICAL THERAPY SHOULDER EVALUATION   Patient Name: Kevin Jenkins MRN: [id number] DOB:1940/06/14, 83 y.o., male Today's Date: 12/27/2023  END OF SESSION:  PT End of Session - 12/27/23 0921     Visit Number 1    Number of Visits 12    Date for Recertification  02/07/24    PT Start Time 0847    PT Stop Time 0948    PT Time Calculation (min) 61 min    Activity Tolerance Patient tolerated treatment well    Behavior During Therapy [facility] for tasks assessed/performed          Past Medical History:  Diagnosis Date   Chronic kidney disease    Hx. of kidney stones   Diabetes mellitus without complication ([facility]) 2006ish   Hypertension    Past Surgical History:  Procedure Laterality Date   CYSTOSCOPY WITH RETROGRADE PYELOGRAM, URETEROSCOPY AND STENT PLACEMENT Right 05/19/2014   Procedure: CYSTOSCOPY WITH RETROGRADE PYELOGRAM, RIGHT URETEROSCOPY LASER  AND REMOVED JJ STENT, REPLACED JJ STENT  PLACEMENT;  Surgeon: Arlena Gal, MD;  [facility];  Service: Urology;  Laterality: Right;   CYSTOSCOPY/RETROGRADE/URETEROSCOPY/STONE EXTRACTION WITH BASKET  2007   LITHOTRIPSY     LITHOTRIPSY     LITHOTRIPSY     Patient Active Problem List   Diagnosis Date Noted   Hyperkalemia 05/15/2014   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    THERAPY DIAG:  Acute pain of right shoulder  Stiffness of right shoulder, not elsewhere classified  Muscle weakness (generalized)  Rationale for Evaluation and Treatment: Rehabilitation  ONSET DATE: ~3 months.  SUBJECTIVE:                                                                                                                                                                                       SUBJECTIVE STATEMENT: The patient presents to the clinic with c/o right shoulder pain that has been ongoing for about 3 months. He states when he reaches out he experiences intense pain.  His resting pain-level is a 3-4/10.  He describes the pain as sore.  PERTINENT HISTORY: DM.  PAIN:  Are you having pain? Yes: NPRS scale: 3-4/10. Pain location: Right shoulder. Pain description: Sore. Aggravating factors: Using it. Relieving factors: Not using it.  PRECAUTIONS: None  RED FLAGS: None   WEIGHT BEARING RESTRICTIONS: No  FALLS:  Has patient fallen in  last 6 months? Yes. Number of falls foot got caught on something resulting in a fall.  He thinks maybe this started his right shoulder pain.    LIVING ENVIRONMENT: Lives in: House/apartment Has following equipment at home: None  OCCUPATION: Turf grass management.    PLOF: Independent  PATIENT GOALS:Move right shoulder without pain.    NEXT MD VISIT:   OBJECTIVE:   PATIENT SURVEYS:  QUICKDASH:  28 points (38.6%).   POSTURE: Rounded shoulders.  UPPER EXTREMITY ROM:   Active antigravity right shoulder flexion to 145 degrees (left is 170 degrees), behind back to L4, ER is 50 degrees.    UPPER EXTREMITY MMT:  Right shoulder IR is 4+/5, ER is 3+/5.  Abduction is 4-/5.    SHOULDER SPECIAL TESTS: Positive right shoulder Impingement testing.    PALPATION:  Tender to palpation over right bicipital groove.                                                                                                                               TREATMENT DATE: 12/27/23:  HMP and IFC at 80-150 Hz on 40% scan x 20 minutes to patient's right shoulder.   UBE at 120 RPM's x 6 minutes f/b RW4 with yellow theraband.   PATIENT EDUCATION: Education details: RW11.   Person educated: Patient Education method: Explanation, Demonstration, Tactile cues, Verbal cues, and Handouts Education  comprehension: verbalized understanding, returned demonstration, verbal cues required, and tactile cues required  HOME EXERCISE PROGRAM: RW4.    ASSESSMENT:  CLINICAL IMPRESSION: The patient presents to [facility] with c/o right shoulder pain over the last three months.  Certain motions cause intense right shoulder pain. He lacks range of motion and demonstrates weakness especially into external rotation.  He exhibits a positive right shoulder impingement test.  He is tender to palpation over his right biceps tendon.  His QUICKDASH score is 28 points (38.6%).  Patient will benefit from skilled physical therapy intervention to address pain and deficits.  OBJECTIVE IMPAIRMENTS: decreased activity tolerance, decreased ROM, decreased strength, postural dysfunction, and pain.   ACTIVITY LIMITATIONS: carrying, lifting, and reach over head  PARTICIPATION LIMITATIONS: occupation and yard work  PERSONAL FACTORS: Time since onset of injury/illness/exacerbation and 1 comorbidity: DM are also affecting patient's functional outcome.   REHAB POTENTIAL: Good  CLINICAL DECISION MAKING: Stable/uncomplicated  EVALUATION COMPLEXITY: Low   GOALS:  SHORT TERM GOALS: Target date: 01/10/24.  Ind with an initial HEP. Goal status: INITIAL   LONG TERM GOALS: Target date: 02/07/24.  Ind with an advanced HEP.  Goal status: INITIAL  2.  Active right shoulder flexion to 155 degrees so the patient can easily reach overhead.  Goal status: INITIAL  3.  Active ER to 75 degrees+ to allow for easily donning/doffing of apparel.  Goal status: INITIAL  4.  Increase right shoulder strength to a solid 4+/5 to increase stability for performance of functional activities.  Goal status: INITIAL  5. Perform  ADL's with pain not > 3/10. Baseline:  Goal status: INITIAL  6.  Improve QUICKDASH score by at least 5 points.  Goal status: INITIAL  PLAN:  PT FREQUENCY: 2x/week  PT DURATION: 6 weeks  PLANNED  INTERVENTIONS: 97110-Therapeutic exercises, 97530- Therapeutic activity, [id number]- Neuromuscular re-education, 97535- Self Care, 02859- Manual therapy, G0283- Electrical stimulation (unattended), 97016- Vasopneumatic device, [id number]- Ultrasound, 79439 (1-2 muscles), 20561 (3+ muscles)- Dry Needling, Patient/Family education, Cryotherapy, and Moist heat  PLAN FOR NEXT SESSION: UBE, UE Ranger, PROM, corner stretch, RW4, PRE's.  Modalities as needed.     Marcelle Bebout, PT 12/27/2023, 10:18 AM

## 2024-01-03 ENCOUNTER — Ambulatory Visit: Admitting: Physical Therapy

## 2024-01-03 DIAGNOSIS — M25611 Stiffness of right shoulder, not elsewhere classified: Secondary | ICD-10-CM

## 2024-01-03 DIAGNOSIS — M25511 Pain in right shoulder: Secondary | ICD-10-CM

## 2024-01-03 DIAGNOSIS — M6281 Muscle weakness (generalized): Secondary | ICD-10-CM

## 2024-01-03 NOTE — Therapy (Signed)
 OUTPATIENT PHYSICAL THERAPY SHOULDER TREATMENT    Patient Name: Kevin Jenkins MRN: [id number] DOB:10-Mar-1940, 83 y.o., male Today's Date: 01/03/2024  END OF SESSION:  PT End of Session - 01/03/24 0852     Visit Number 2    Number of Visits 12    Date for Recertification  02/07/24    PT Start Time 0845    PT Stop Time 0935    PT Time Calculation (min) 50 min    Activity Tolerance Patient tolerated treatment well    Behavior During Therapy [facility] for tasks assessed/performed          Past Medical History:  Diagnosis Date   Chronic kidney disease    Hx. of kidney stones   Diabetes mellitus without complication ([facility]) 2006ish   Hypertension    Past Surgical History:  Procedure Laterality Date   CYSTOSCOPY WITH RETROGRADE PYELOGRAM, URETEROSCOPY AND STENT PLACEMENT Right 05/19/2014   Procedure: CYSTOSCOPY WITH RETROGRADE PYELOGRAM, RIGHT URETEROSCOPY LASER  AND REMOVED JJ STENT, REPLACED JJ STENT  PLACEMENT;  Surgeon: Arlena Gal, MD;  [facility];  Service: Urology;  Laterality: Right;   CYSTOSCOPY/RETROGRADE/URETEROSCOPY/STONE EXTRACTION WITH BASKET  2007   LITHOTRIPSY     LITHOTRIPSY     LITHOTRIPSY     Patient Active Problem List   Diagnosis Date Noted   Hyperkalemia 05/15/2014   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    THERAPY DIAG:  Acute pain of right shoulder  Stiffness of right shoulder, not elsewhere classified  Muscle weakness (generalized)  Rationale for Evaluation and Treatment: Rehabilitation  ONSET DATE: ~3 months.  SUBJECTIVE:                                                                                                                                                                                       SUBJECTIVE STATEMENT: Heat makes it feel better.   PERTINENT HISTORY: DM.  PAIN:  Are you having pain? Yes: NPRS scale: 3-4/10. Pain location: Right shoulder. Pain description: Sore. Aggravating factors: Using it. Relieving factors: Not using it.  PRECAUTIONS: None  RED FLAGS: None   WEIGHT BEARING RESTRICTIONS: No  FALLS:  Has patient fallen in last 6 months? Yes. Number of falls foot got caught on something resulting in a fall.  He thinks maybe this started his right shoulder pain.    LIVING ENVIRONMENT: Lives in: House/apartment Has following  equipment at home: None  OCCUPATION: Turf grass management.    PLOF: Independent  PATIENT GOALS:Move right shoulder without pain.    NEXT MD VISIT:   OBJECTIVE:   PATIENT SURVEYS:  QUICKDASH:  28 points (38.6%).   POSTURE: Rounded shoulders.  UPPER EXTREMITY ROM:   Active antigravity right shoulder flexion to 145 degrees (left is 170 degrees), behind back to L4, ER is 50 degrees.    UPPER EXTREMITY MMT:  Right shoulder IR is 4+/5, ER is 3+/5.  Abduction is 4-/5.    SHOULDER SPECIAL TESTS: Positive right shoulder Impingement testing.    PALPATION:  Tender to palpation over right bicipital groove.                                                                                                                               TREATMENT DATE:   01/03/24:                                      EXERCISE LOG  Exercise Repetitions and Resistance Comments  UBE 10 minutes   RW4 Yellow theraband    Pulleys 5 minutes   UE ranger  5 minutes        HMP and IFC at 80-150 Hz on 40% scan x 20 minutes to patient's right shoulder.  Normal modality resposne following removal of modality.     12/27/23:  HMP and IFC at 80-150 Hz on 40% scan x 20 minutes to patient's right shoulder.   UBE at 120 RPM's x 6 minutes f/b RW4 with yellow theraband.   PATIENT  EDUCATION: Education details: RW45.   Person educated: Patient Education method: Explanation, Demonstration, Tactile cues, Verbal cues, and Handouts Education comprehension: verbalized understanding, returned demonstration, verbal cues required, and tactile cues required  HOME EXERCISE PROGRAM: RW4.    ASSESSMENT:  CLINICAL IMPRESSION: Patient did well today with therex performing without complaint.    OBJECTIVE IMPAIRMENTS: decreased activity tolerance, decreased ROM, decreased strength, postural dysfunction, and pain.   ACTIVITY LIMITATIONS: carrying, lifting, and reach over head  PARTICIPATION LIMITATIONS: occupation and yard work  PERSONAL FACTORS: Time since onset of injury/illness/exacerbation and 1 comorbidity: DM are also affecting patient's functional outcome.   REHAB POTENTIAL: Good  CLINICAL DECISION MAKING: Stable/uncomplicated  EVALUATION COMPLEXITY: Low   GOALS:  SHORT TERM GOALS: Target date: 01/10/24.  Ind with an initial HEP. Goal status: INITIAL   LONG TERM GOALS: Target date: 02/07/24.  Ind with an advanced HEP.  Goal status: INITIAL  2.  Active right shoulder flexion to 155 degrees so the patient can easily reach overhead.  Goal status: INITIAL  3.  Active ER to 75 degrees+ to allow for easily donning/doffing of apparel.  Goal status: INITIAL  4.  Increase right shoulder strength to a solid 4+/5 to increase stability for performance of functional activities.  Goal status: INITIAL  5. Perform ADL's with pain not > 3/10. Baseline:  Goal status: INITIAL  6.  Improve QUICKDASH score by at least 5 points.  Goal status: INITIAL  PLAN:  PT FREQUENCY: 2x/week  PT DURATION: 6 weeks  PLANNED INTERVENTIONS: 97110-Therapeutic exercises, 97530- Therapeutic activity, [id number]- Neuromuscular re-education, 97535- Self Care, 02859- Manual therapy, G0283- Electrical stimulation (unattended), 97016- Vasopneumatic device, [id number]- Ultrasound, 79439 (1-2  muscles), 20561 (3+ muscles)- Dry Needling, Patient/Family education, Cryotherapy, and Moist heat  PLAN FOR NEXT SESSION: UBE, UE Ranger, PROM, corner stretch, RW4, PRE's.  Modalities as needed.     Euell Schiff, PT 01/03/2024, 10:21 AM

## 2024-01-10 ENCOUNTER — Encounter: Payer: Self-pay | Admitting: *Deleted

## 2024-01-10 ENCOUNTER — Ambulatory Visit: Admitting: *Deleted

## 2024-01-10 DIAGNOSIS — M25511 Pain in right shoulder: Secondary | ICD-10-CM

## 2024-01-10 DIAGNOSIS — M25611 Stiffness of right shoulder, not elsewhere classified: Secondary | ICD-10-CM

## 2024-01-10 DIAGNOSIS — M6281 Muscle weakness (generalized): Secondary | ICD-10-CM

## 2024-01-10 NOTE — Therapy (Signed)
 OUTPATIENT PHYSICAL THERAPY SHOULDER TREATMENT    Patient Name: Kevin Jenkins MRN: [id number] DOB:Sep 28, 1940, 83 y.o., male Today's Date: 01/10/2024  END OF SESSION:  PT End of Session - 01/10/24 0854     Visit Number 3    Number of Visits 12    Date for Recertification  02/07/24    PT Start Time 0845    PT Stop Time 0935    PT Time Calculation (min) 50 min          Past Medical History:  Diagnosis Date   Chronic kidney disease    Hx. of kidney stones   Diabetes mellitus without complication (HCC) 2006ish   Hypertension    Past Surgical History:  Procedure Laterality Date   CYSTOSCOPY WITH RETROGRADE PYELOGRAM, URETEROSCOPY AND STENT PLACEMENT Right 05/19/2014   Procedure: CYSTOSCOPY WITH RETROGRADE PYELOGRAM, RIGHT URETEROSCOPY LASER  AND REMOVED JJ STENT, REPLACED JJ STENT  PLACEMENT;  Surgeon: Arlena Gal, MD;  Location: [facility];  Service: Urology;  Laterality: Right;   CYSTOSCOPY/RETROGRADE/URETEROSCOPY/STONE EXTRACTION WITH BASKET  2007   LITHOTRIPSY     LITHOTRIPSY     LITHOTRIPSY     Patient Active Problem List   Diagnosis Date Noted   Hyperkalemia 05/15/2014   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    THERAPY DIAG:  Acute pain of right shoulder  Stiffness of right shoulder, not elsewhere classified  Muscle weakness (generalized)  Rationale for Evaluation and Treatment: Rehabilitation  ONSET DATE: ~3 months.  SUBJECTIVE:                                                                                                                                                                                      SUBJECTIVE STATEMENT: Heat makes it feel better.   PERTINENT  HISTORY: DM.  PAIN:  Are you having pain? Yes: NPRS scale: 3-4/10. Pain location: Right shoulder. Pain description: Sore. Aggravating factors: Using it. Relieving factors: Not using it.  PRECAUTIONS: None  RED FLAGS: None   WEIGHT BEARING RESTRICTIONS: No  FALLS:  Has patient fallen in last 6 months? Yes. Number of falls foot got caught on something resulting in a fall.  He thinks maybe this started his right shoulder pain.    LIVING ENVIRONMENT: Lives in: House/apartment Has following equipment at home: None  OCCUPATION: Turf grass management.    PLOF: Independent  PATIENT GOALS:Move right shoulder  without pain.    NEXT MD VISIT:   OBJECTIVE:   PATIENT SURVEYS:  QUICKDASH:  28 points (38.6%).   POSTURE: Rounded shoulders.  UPPER EXTREMITY ROM:   Active antigravity right shoulder flexion to 145 degrees (left is 170 degrees), behind back to L4, ER is 50 degrees.    UPPER EXTREMITY MMT:  Right shoulder IR is 4+/5, ER is 3+/5.  Abduction is 4-/5.    SHOULDER SPECIAL TESTS: Positive right shoulder Impingement testing.    PALPATION:  Tender to palpation over right bicipital groove.                                                                                                                               TREATMENT DATE:   01/10/24:                                      EXERCISE LOG    RT shldr  Exercise Repetitions and Resistance Comments  UBE 10 minutes   RW4 Yellow theraband  3x 10-15 each way  Cues to slow down  Pulleys 6 minutes   UE ranger seated 6 minutes        HMP and IFC Reviewed and discussed HEP for RT shldr     12/27/23:  HMP and IFC at 80-150 Hz on 40% scan x 20 minutes to patient's right shoulder.   UBE at 120 RPM's x 6 minutes f/b RW4 with yellow theraband.   PATIENT EDUCATION: Education details: RW31.   Person educated: Patient Education method: Explanation, Demonstration, Tactile cues, Verbal cues, and Handouts Education  comprehension: verbalized understanding, returned demonstration, verbal cues required, and tactile cues required  HOME EXERCISE PROGRAM: RW4.    ASSESSMENT:  CLINICAL IMPRESSION: Patient arrived today doing fair with RT shldr. Rx focused on ROM progression as well as light strengthening with yellow Tband and did well.    OBJECTIVE IMPAIRMENTS: decreased activity tolerance, decreased ROM, decreased strength, postural dysfunction, and pain.   ACTIVITY LIMITATIONS: carrying, lifting, and reach over head  PARTICIPATION LIMITATIONS: occupation and yard work  PERSONAL FACTORS: Time since onset of injury/illness/exacerbation and 1 comorbidity: DM are also affecting patient's functional outcome.   REHAB POTENTIAL: Good  CLINICAL DECISION MAKING: Stable/uncomplicated  EVALUATION COMPLEXITY: Low   GOALS:  SHORT TERM GOALS: Target date: 01/10/24.  Ind with an initial HEP. Goal status: INITIAL   LONG TERM GOALS: Target date: 02/07/24.  Ind with an advanced HEP.  Goal status: INITIAL  2.  Active right shoulder flexion to 155 degrees so the patient can easily reach overhead.  Goal status: INITIAL  3.  Active ER to 75 degrees+ to allow for easily donning/doffing of apparel.  Goal status: INITIAL  4.  Increase right shoulder strength to a solid 4+/5 to increase stability for performance of functional activities.  Goal status: INITIAL  5. Perform ADL's with  pain not > 3/10. Baseline:  Goal status: INITIAL  6.  Improve QUICKDASH score by at least 5 points.  Goal status: INITIAL  PLAN:  PT FREQUENCY: 2x/week  PT DURATION: 6 weeks  PLANNED INTERVENTIONS: 97110-Therapeutic exercises, 97530- Therapeutic activity, [id number]- Neuromuscular re-education, 97535- Self Care, 02859- Manual therapy, G0283- Electrical stimulation (unattended), 97016- Vasopneumatic device, [id number]- Ultrasound, 79439 (1-2 muscles), 20561 (3+ muscles)- Dry Needling, Patient/Family education, Cryotherapy, and  Moist heat  PLAN FOR NEXT SESSION: UBE, UE Ranger, PROM, corner stretch, RW4, PRE's.  Modalities as needed.     Karia Ehresman,CHRIS, PTA 01/10/2024, 10:59 AM

## 2024-01-17 ENCOUNTER — Ambulatory Visit: Admitting: Physical Therapy

## 2024-01-17 NOTE — Therapy (Deleted)
 OUTPATIENT PHYSICAL THERAPY SHOULDER TREATMENT    Patient Name: Kevin Jenkins MRN: [id number] DOB:07-09-1940, 83 y.o., male Today's Date: 01/17/2024  END OF SESSION:    Past Medical History:  Diagnosis Date   Chronic kidney disease    Hx. of kidney stones   Diabetes mellitus without complication (HCC) 2006ish   Hypertension    Past Surgical History:  Procedure Laterality Date   CYSTOSCOPY WITH RETROGRADE PYELOGRAM, URETEROSCOPY AND STENT PLACEMENT Right 05/19/2014   Procedure: CYSTOSCOPY WITH RETROGRADE PYELOGRAM, RIGHT URETEROSCOPY LASER  AND REMOVED JJ STENT, REPLACED JJ STENT  PLACEMENT;  Surgeon: Arlena Gal, MD;  Location: [facility];  Service: Urology;  Laterality: Right;   CYSTOSCOPY/RETROGRADE/URETEROSCOPY/STONE EXTRACTION WITH BASKET  2007   LITHOTRIPSY     LITHOTRIPSY     LITHOTRIPSY     Patient Active Problem List   Diagnosis Date Noted   Hyperkalemia 05/15/2014   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    THERAPY DIAG:  No diagnosis found.  Rationale for Evaluation and Treatment: Rehabilitation  ONSET DATE: ~3 months.  SUBJECTIVE:                                                                                                                                                                                      SUBJECTIVE STATEMENT: *** Heat makes it feel better.   PERTINENT HISTORY: DM.  PAIN:  Are you having pain? Yes: NPRS scale: 3-4/10. Pain location: Right shoulder. Pain description: Sore. Aggravating factors: Using it. Relieving factors: Not using it.  PRECAUTIONS: None  RED FLAGS: None   WEIGHT BEARING RESTRICTIONS: No  FALLS:  Has patient fallen  in last 6 months? Yes. Number of falls foot got caught on something resulting in a fall.  He thinks maybe this started his right shoulder pain.    LIVING ENVIRONMENT: Lives in: House/apartment Has following equipment at home: None  OCCUPATION: Turf grass management.    PLOF: Independent  PATIENT GOALS:Move right shoulder without pain.    NEXT MD VISIT:   OBJECTIVE:   PATIENT SURVEYS:  QUICKDASH:  28 points (38.6%).   POSTURE: Rounded shoulders.  UPPER EXTREMITY ROM:  Active antigravity right shoulder flexion to 145 degrees (left is 170 degrees), behind back to L4, ER is 50 degrees.    UPPER EXTREMITY MMT: Right shoulder IR is 4+/5, ER is 3+/5.  Abduction is 4-/5.  SHOULDER SPECIAL TESTS: Positive right shoulder Impingement testing.    PALPATION:  Tender to palpation over right bicipital groove.                                                                                                                               TREATMENT DATE:  01/17/24:                                      EXERCISE LOG    RT shldr  Exercise Repetitions and Resistance Comments  UBE 10 minutes   RW4 Yellow theraband  3x 10-15 each way  Cues to slow down  Pulleys 6 minutes   UE ranger seated 6 minutes        HMP and IFC Reviewed and discussed HEP for RT shldr    01/10/24:                                      EXERCISE LOG    RT shldr  Exercise Repetitions and Resistance Comments  UBE 10 minutes   RW4 Yellow theraband  3x 10-15 each way  Cues to slow down  Pulleys 6 minutes   UE ranger seated 6 minutes        HMP and IFC Reviewed and discussed HEP for RT shldr     12/27/23:  HMP and IFC at 80-150 Hz on 40% scan x 20 minutes to patient's right shoulder.   UBE at 120 RPM's x 6 minutes f/b RW4 with yellow theraband.   PATIENT EDUCATION: Education details: RW28.   Person educated: Patient Education method: Explanation, Demonstration, Tactile cues, Verbal cues, and  Handouts Education comprehension: verbalized understanding, returned demonstration, verbal cues required, and tactile cues required  HOME EXERCISE PROGRAM: RW4.    ASSESSMENT:  CLINICAL IMPRESSION: *** Patient arrived today doing fair with RT shldr. Rx focused on ROM progression as well as light strengthening with yellow Tband and did well.    OBJECTIVE IMPAIRMENTS: decreased activity tolerance, decreased ROM, decreased strength, postural dysfunction, and pain.   ACTIVITY LIMITATIONS: carrying, lifting, and reach over head  PARTICIPATION LIMITATIONS: occupation and yard work  PERSONAL FACTORS: Time since onset of injury/illness/exacerbation and 1 comorbidity: DM are also affecting patient's functional outcome.   REHAB POTENTIAL: Good  CLINICAL DECISION MAKING: Stable/uncomplicated  EVALUATION COMPLEXITY: Low   GOALS:  SHORT TERM GOALS: Target date: 01/10/24.  Ind with an initial HEP. Goal status: INITIAL   LONG TERM GOALS: Target date: 02/07/24.  Ind with an advanced HEP.  Goal status: INITIAL  2.  Active right shoulder flexion to 155 degrees so the patient can easily reach overhead.  Goal status: INITIAL  3.  Active ER to 75 degrees+ to allow for easily donning/doffing of apparel.  Goal status: INITIAL  4.  Increase right shoulder strength to a solid 4+/5 to increase stability for performance of functional activities.  Goal status: INITIAL  5. Perform ADL's with pain not > 3/10. Baseline:  Goal status: INITIAL  6.  Improve QUICKDASH score by at least 5 points.  Goal status: INITIAL  PLAN:  PT FREQUENCY: 2x/week  PT DURATION: 6 weeks  PLANNED INTERVENTIONS: 97110-Therapeutic exercises, 97530- Therapeutic activity, [id number]- Neuromuscular re-education, 97535- Self Care, 02859- Manual therapy, G0283- Electrical stimulation (unattended), 97016- Vasopneumatic device, [id number]- Ultrasound, 79439 (1-2 muscles), 20561 (3+ muscles)- Dry Needling, Patient/Family  education, Cryotherapy, and Moist heat  PLAN FOR NEXT SESSION: UBE, UE Ranger, PROM, corner stretch, RW4, PRE's.  Modalities as needed.     Aashi Derrington April Ma L Nikiyah Fackler, PT 01/17/2024, 7:55 AM

## 2024-01-24 ENCOUNTER — Ambulatory Visit: Admitting: *Deleted

## 2024-01-31 ENCOUNTER — Ambulatory Visit: Admitting: *Deleted

## 2024-01-31 ENCOUNTER — Encounter: Payer: Self-pay | Admitting: *Deleted

## 2024-01-31 DIAGNOSIS — M25611 Stiffness of right shoulder, not elsewhere classified: Secondary | ICD-10-CM

## 2024-01-31 DIAGNOSIS — M25511 Pain in right shoulder: Secondary | ICD-10-CM

## 2024-01-31 DIAGNOSIS — M6281 Muscle weakness (generalized): Secondary | ICD-10-CM | POA: Insufficient documentation

## 2024-01-31 NOTE — Therapy (Signed)
 OUTPATIENT PHYSICAL THERAPY SHOULDER TREATMENT    Patient Name: Kevin Jenkins MRN: [id number] DOB:Feb 25, 1940, 83 y.o., male Today's Date: 01/31/2024  END OF SESSION:  PT End of Session - 01/31/24 0856     Visit Number 4    Number of Visits 12    Date for Recertification  02/07/24    PT Start Time 0846    PT Stop Time 0935    PT Time Calculation (min) 49 min           Past Medical History:  Diagnosis Date   Chronic kidney disease    Hx. of kidney stones   Diabetes mellitus without complication (HCC) 2006ish   Hypertension    Past Surgical History:  Procedure Laterality Date   CYSTOSCOPY WITH RETROGRADE PYELOGRAM, URETEROSCOPY AND STENT PLACEMENT Right 05/19/2014   Procedure: CYSTOSCOPY WITH RETROGRADE PYELOGRAM, RIGHT URETEROSCOPY LASER  AND REMOVED JJ STENT, REPLACED JJ STENT  PLACEMENT;  Surgeon: Arlena Gal, MD;  Location: [facility];  Service: Urology;  Laterality: Right;   CYSTOSCOPY/RETROGRADE/URETEROSCOPY/STONE EXTRACTION WITH BASKET  2007   LITHOTRIPSY     LITHOTRIPSY     LITHOTRIPSY     Patient Active Problem List   Diagnosis Date Noted   Hyperkalemia 05/15/2014   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    THERAPY DIAG:  Acute pain of right shoulder  Stiffness of right shoulder, not elsewhere classified  Muscle weakness (generalized)  Rationale for Evaluation and Treatment: Rehabilitation  ONSET DATE: ~3 months.  SUBJECTIVE:                                                                                                                                                                                      SUBJECTIVE STATEMENT:  Heat makes it feel better.    PERTINENT HISTORY: DM.  PAIN:  Are you having pain? Yes: NPRS scale: 3-4/10. Pain location: Right shoulder. Pain description: Sore. Aggravating factors: Using it. Relieving factors: Not using it.  PRECAUTIONS: None  RED FLAGS: None   WEIGHT BEARING RESTRICTIONS: No  FALLS:  Has patient fallen in last 6 months? Yes. Number of falls foot got caught on something resulting in a fall.  He thinks maybe this started his right shoulder pain.    LIVING ENVIRONMENT: Lives in: House/apartment Has following equipment at home: None  OCCUPATION: Turf grass management.    PLOF: Independent  PATIENT GOALS:Move  right shoulder without pain.    NEXT MD VISIT:   OBJECTIVE:   PATIENT SURVEYS:  QUICKDASH:  28 points (38.6%).   POSTURE: Rounded shoulders.  UPPER EXTREMITY ROM:  Active antigravity right shoulder flexion to 145 degrees (left is 170 degrees), behind back to L4, ER is 50 degrees.    UPPER EXTREMITY MMT: Right shoulder IR is 4+/5, ER is 3+/5.  Abduction is 4-/5.    SHOULDER SPECIAL TESTS: Positive right shoulder Impingement testing.    PALPATION:  Tender to palpation over right bicipital groove.                                                                                                                               TREATMENT DATE:  01/31/24:                                      EXERCISE LOG    RT shldr  Exercise Repetitions and Resistance Comments  UBE 10 minutes 90 RPMs   RW4 RED theraband  3x 10-15 each way  Cues to slow down  Pulleys 6 minutes   UE ranger seated 6 minutes         HMP and IFC at 80-150 Hz on 40% scan x 15 minutes to patient's right shoulder.     01/10/24:                                      EXERCISE LOG    RT shldr  Exercise Repetitions and Resistance Comments  UBE 10 minutes   RW4 RED theraband  3x 10-15 each way  Cues to slow down  IR was painful  Pulleys 6 minutes   UE ranger seated 6 minutes        HMP and  IFC Reviewed and discussed HEP for RT shldr     12/27/23:  HMP and IFC at 80-150 Hz on 40% scan x 20 minutes to patient's right shoulder.   UBE at 120 RPM's x 6 minutes f/b RW4 with yellow theraband.   PATIENT EDUCATION: Education details: RW45.   Person educated: Patient Education method: Explanation, Demonstration, Tactile cues, Verbal cues, and Handouts Education comprehension: verbalized understanding, returned demonstration, verbal cues required, and tactile cues required  HOME EXERCISE PROGRAM: RW4.    ASSESSMENT:  CLINICAL IMPRESSION:  Patient arrived today doing fair with RT shldr. Rx focused on ROM progression as well as light strengthening with RED Tband and did well. IFC and HMP end of session   OBJECTIVE IMPAIRMENTS: decreased activity tolerance, decreased ROM, decreased strength, postural dysfunction, and pain.   ACTIVITY LIMITATIONS: carrying, lifting, and reach over head  PARTICIPATION LIMITATIONS: occupation and yard work  PERSONAL FACTORS: Time since onset of injury/illness/exacerbation and 1 comorbidity: DM are also affecting patient's functional  outcome.   REHAB POTENTIAL: Good  CLINICAL DECISION MAKING: Stable/uncomplicated  EVALUATION COMPLEXITY: Low   GOALS:  SHORT TERM GOALS: Target date: 01/10/24.  Ind with an initial HEP. Goal status: MET   LONG TERM GOALS: Target date: 02/07/24.  Ind with an advanced HEP.  Goal status: INITIAL  2.  Active right shoulder flexion to 155 degrees so the patient can easily reach overhead.  Goal status: INITIAL  3.  Active ER to 75 degrees+ to allow for easily donning/doffing of apparel.  Goal status: INITIAL  4.  Increase right shoulder strength to a solid 4+/5 to increase stability for performance of functional activities.  Goal status: INITIAL  5. Perform ADL's with pain not > 3/10. Baseline:  Goal status: INITIAL  6.  Improve QUICKDASH score by at least 5 points.  Goal status:  INITIAL  PLAN:  PT FREQUENCY: 2x/week  PT DURATION: 6 weeks  PLANNED INTERVENTIONS: 97110-Therapeutic exercises, 97530- Therapeutic activity, [id number]- Neuromuscular re-education, 97535- Self Care, 02859- Manual therapy, G0283- Electrical stimulation (unattended), 97016- Vasopneumatic device, [id number]- Ultrasound, 79439 (1-2 muscles), 20561 (3+ muscles)- Dry Needling, Patient/Family education, Cryotherapy, and Moist heat  PLAN FOR NEXT SESSION: UBE, UE Ranger, PROM, corner stretch, RW4, PRE's.  Modalities as needed.     Daneka Lantigua,CHRIS, PTA 01/31/2024, 12:07 PM

## 2024-02-07 ENCOUNTER — Encounter: Payer: Self-pay | Admitting: *Deleted

## 2024-02-07 ENCOUNTER — Ambulatory Visit: Admitting: *Deleted

## 2024-02-07 DIAGNOSIS — M25611 Stiffness of right shoulder, not elsewhere classified: Secondary | ICD-10-CM

## 2024-02-07 DIAGNOSIS — M25511 Pain in right shoulder: Secondary | ICD-10-CM | POA: Diagnosis not present

## 2024-02-07 DIAGNOSIS — M6281 Muscle weakness (generalized): Secondary | ICD-10-CM

## 2024-02-07 NOTE — Therapy (Signed)
 " OUTPATIENT PHYSICAL THERAPY SHOULDER TREATMENT    Patient Name: Kevin Jenkins MRN: [id number] DOB:1941/01/06, 83 y.o., male Today's Date: 02/07/2024  END OF SESSION:  PT End of Session - 02/07/24 0948     Visit Number 5    Number of Visits 12    Date for Recertification  02/07/24    PT Start Time 0934    PT Stop Time 1023    PT Time Calculation (min) 49 min           Past Medical History:  Diagnosis Date   Chronic kidney disease    Hx. of kidney stones   Diabetes mellitus without complication (HCC) 2006ish   Hypertension    Past Surgical History:  Procedure Laterality Date   CYSTOSCOPY WITH RETROGRADE PYELOGRAM, URETEROSCOPY AND STENT PLACEMENT Right 05/19/2014   Procedure: CYSTOSCOPY WITH RETROGRADE PYELOGRAM, RIGHT URETEROSCOPY LASER  AND REMOVED JJ STENT, REPLACED JJ STENT  PLACEMENT;  Surgeon: Arlena Gal, MD;  Location: [facility];  Service: Urology;  Laterality: Right;   CYSTOSCOPY/RETROGRADE/URETEROSCOPY/STONE EXTRACTION WITH BASKET  2007   LITHOTRIPSY     LITHOTRIPSY     LITHOTRIPSY     Patient Active Problem List   Diagnosis Date Noted   Hyperkalemia 05/15/2014   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    THERAPY DIAG:  Acute pain of right shoulder  Stiffness of right shoulder, not elsewhere classified  Muscle weakness (generalized)  Rationale for Evaluation and Treatment: Rehabilitation  ONSET DATE: ~3 months.  SUBJECTIVE:                                                                                                                                                                                      SUBJECTIVE STATEMENT:  Heat makes it feel better. Pulleys  hurt some  PERTINENT HISTORY: DM.  PAIN:  Are you having pain? Yes: NPRS scale: 3-4/10. Pain location: Right shoulder. Pain description: Sore. Aggravating factors: Using it. Relieving factors: Not using it.  PRECAUTIONS: None  RED FLAGS: None   WEIGHT BEARING RESTRICTIONS: No  FALLS:  Has patient fallen in last 6 months? Yes. Number of falls foot got caught on something resulting in a fall.  He thinks maybe this started his right shoulder pain.    LIVING ENVIRONMENT: Lives in: House/apartment Has following equipment at home: None  OCCUPATION: Turf grass management.    PLOF: Independent  PATIENT GOALS:Move right shoulder without pain.    NEXT MD VISIT:   OBJECTIVE:   PATIENT SURVEYS:  QUICKDASH:  28 points (38.6%).   POSTURE: Rounded shoulders.  UPPER EXTREMITY ROM:  Active antigravity right shoulder flexion to 145 degrees (left is 170 degrees), behind back to L4, ER is 50 degrees.    UPPER EXTREMITY MMT: Right shoulder IR is 4+/5, ER is 3+/5.  Abduction is 4-/5.    SHOULDER SPECIAL TESTS: Positive right shoulder Impingement testing.    PALPATION:  Tender to palpation over right bicipital groove.                                                                                                                               TREATMENT DATE:  02/07/24:                                      EXERCISE LOG    RT shldr  Exercise Repetitions and Resistance Comments  UBE 8 minutes 90 RPMs   RW4 Green  theraband  3x 10-15 each way  Cues to slow down      UE ranger seated 6 minutes         US  combo x 8 mins 1.5 w/cm2  to RT shldr  HMP and IFC at 80-150 Hz on 40% scan x 15 minutes to patient's right shoulder.     01/10/24:                                      EXERCISE LOG    RT shldr  Exercise Repetitions and Resistance Comments  UBE 10 minutes   RW4 RED theraband  3x 10-15 each way  Cues to slow down  IR was painful  Pulleys 6 minutes   UE ranger  seated 6 minutes        HMP and IFC Reviewed and discussed HEP for RT shldr     12/27/23:  HMP and IFC at 80-150 Hz on 40% scan x 20 minutes to patient's right shoulder.   UBE at 120 RPM's x 6 minutes f/b RW4 with yellow theraband.   PATIENT EDUCATION: Education details: RW56.   Person educated: Patient Education method: Explanation, Demonstration, Tactile cues, Verbal cues, and Handouts Education comprehension: verbalized understanding, returned demonstration, verbal cues required, and tactile cues required  HOME EXERCISE PROGRAM: RW4.    ASSESSMENT:  CLINICAL IMPRESSION:  Patient arrived today doing fair with RT shldr and reports that it still hurts with certain motions.. Rx focused on ROM progression as well as light strengthening with Green Tband and US  combo was also added today. IFC and HMP end of session. Pt reports decreased pain after session  OBJECTIVE IMPAIRMENTS: decreased activity tolerance, decreased ROM, decreased strength, postural dysfunction, and pain.  ACTIVITY LIMITATIONS: carrying, lifting, and reach over head  PARTICIPATION LIMITATIONS: occupation and yard work  PERSONAL FACTORS: Time since onset of injury/illness/exacerbation and 1 comorbidity: DM are also affecting patient's functional outcome.   REHAB POTENTIAL: Good  CLINICAL DECISION MAKING: Stable/uncomplicated  EVALUATION COMPLEXITY: Low   GOALS:  SHORT TERM GOALS: Target date: 01/10/24.  Ind with an initial HEP. Goal status: MET   LONG TERM GOALS: Target date: 02/07/24.  Ind with an advanced HEP.  Goal status: On going  2.  Active right shoulder flexion to 155 degrees so the patient can easily reach overhead.  Goal status: On going  3.  Active ER to 75 degrees+ to allow for easily donning/doffing of apparel.  Goal status: On going  4.  Increase right shoulder strength to a solid 4+/5 to increase stability for performance of functional activities.  Goal status: On going 5.  Perform ADL's with pain not > 3/10. Baseline:  Goal status: On going  6.  Improve QUICKDASH score by at least 5 points.  Goal status: On going  PLAN:  PT FREQUENCY: 2x/week  PT DURATION: 6 weeks  PLANNED INTERVENTIONS: 97110-Therapeutic exercises, 97530- Therapeutic activity, [id number]- Neuromuscular re-education, 97535- Self Care, 02859- Manual therapy, G0283- Electrical stimulation (unattended), 97016- Vasopneumatic device, [id number]- Ultrasound, 79439 (1-2 muscles), 20561 (3+ muscles)- Dry Needling, Patient/Family education, Cryotherapy, and Moist heat  PLAN FOR NEXT SESSION: UBE, UE Ranger, PROM, corner stretch, RW4, PRE's.  Modalities as needed.     Koleen Celia,CHRIS, PTA 02/07/2024, 4:47 PM   "

## 2024-02-14 ENCOUNTER — Ambulatory Visit: Admitting: *Deleted

## 2024-02-14 ENCOUNTER — Encounter: Payer: Self-pay | Admitting: *Deleted

## 2024-02-14 DIAGNOSIS — M25511 Pain in right shoulder: Secondary | ICD-10-CM | POA: Diagnosis not present

## 2024-02-14 DIAGNOSIS — M6281 Muscle weakness (generalized): Secondary | ICD-10-CM

## 2024-02-14 DIAGNOSIS — M25611 Stiffness of right shoulder, not elsewhere classified: Secondary | ICD-10-CM

## 2024-02-14 NOTE — Therapy (Signed)
 " OUTPATIENT PHYSICAL THERAPY SHOULDER TREATMENT    Patient Name: Kevin Jenkins MRN: [id number] DOB:[birth year], 83 y.o., male Today's Date: 02/14/2024  END OF SESSION:  PT End of Session - 02/14/24 0906     Visit Number 6    Number of Visits 12    Date for Recertification  02/07/24    PT Start Time 0845    PT Stop Time 0949    PT Time Calculation (min) 64 min           Past Medical History:  Diagnosis Date   Chronic kidney disease    Hx. of kidney stones   Diabetes mellitus without complication (HCC) 2006ish   Hypertension    Past Surgical History:  Procedure Laterality Date   CYSTOSCOPY WITH RETROGRADE PYELOGRAM, URETEROSCOPY AND STENT PLACEMENT Right 05/19/2014   Procedure: CYSTOSCOPY WITH RETROGRADE PYELOGRAM, RIGHT URETEROSCOPY LASER  AND REMOVED JJ STENT, REPLACED JJ STENT  PLACEMENT;  Surgeon: Arlena Gal, MD;  Location: [facility];  Service: Urology;  Laterality: Right;   CYSTOSCOPY/RETROGRADE/URETEROSCOPY/STONE EXTRACTION WITH BASKET  2007   LITHOTRIPSY     LITHOTRIPSY     LITHOTRIPSY     Patient Active Problem List   Diagnosis Date Noted   Hyperkalemia 05/15/2014   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    THERAPY DIAG:  Acute pain of right shoulder  Stiffness of right shoulder, not elsewhere classified  Muscle weakness (generalized)  Rationale for Evaluation and Treatment: Rehabilitation  ONSET DATE: ~3 months.  SUBJECTIVE:                                                                                                                                                                                      SUBJECTIVE STATEMENT:  Did better last Rx  PERTINENT  HISTORY: DM.  PAIN:  Are you having pain? Yes: NPRS scale: 3-4/10. Pain location: Right shoulder. Pain description: Sore. Aggravating factors: Using it. Relieving factors: Not using it.  PRECAUTIONS: None  RED FLAGS: None   WEIGHT BEARING RESTRICTIONS: No  FALLS:  Has patient fallen in last 6 months? Yes. Number of falls foot got caught on something resulting in a fall.  He thinks maybe this started his right shoulder pain.    LIVING ENVIRONMENT: Lives in: House/apartment Has following equipment at home: None  OCCUPATION: Turf grass management.    PLOF: Independent  PATIENT GOALS:Move right  shoulder without pain.    NEXT MD VISIT:   OBJECTIVE:   PATIENT SURVEYS:  QUICKDASH:  28 points (38.6%).   POSTURE: Rounded shoulders.  UPPER EXTREMITY ROM:  Active antigravity right shoulder flexion to 145 degrees (left is 170 degrees), behind back to L4, ER is 50 degrees.    UPPER EXTREMITY MMT: Right shoulder IR is 4+/5, ER is 3+/5.  Abduction is 4-/5.    SHOULDER SPECIAL TESTS: Positive right shoulder Impingement testing.    PALPATION:  Tender to palpation over right bicipital groove.                                                                                                                               TREATMENT DATE:  02/14/24:                                      EXERCISE LOG    RT shldr  Exercise Repetitions and Resistance Comments  UBE 10 minutes 90 RPMs   RW4 Green  theraband  3x 10-15 each way  Cues to slow down      UE ranger seated 6 minutes         US  combo x 7 mins 1.5 w/cm2  to RT shldr Manual STW to to lataral and posterior deltoid  HMP and IFC at 80-150 Hz on 40% scan x 15 minutes to patient's right shoulder.     01/10/24:                                      EXERCISE LOG    RT shldr  Exercise Repetitions and Resistance Comments  UBE 10 minutes   RW4 RED theraband  3x 10-15 each way  Cues to slow down  IR was painful  Pulleys  6 minutes   UE ranger seated 6 minutes        HMP and IFC Reviewed and discussed HEP for RT shldr     12/27/23:  HMP and IFC at 80-150 Hz on 40% scan x 20 minutes to patient's right shoulder.   UBE at 120 RPM's x 6 minutes f/b RW4 with yellow theraband.   PATIENT EDUCATION: Education details: RW56.   Person educated: Patient Education method: Explanation, Demonstration, Tactile cues, Verbal cues, and Handouts Education comprehension: verbalized understanding, returned demonstration, verbal cues required, and tactile cues required  HOME EXERCISE PROGRAM: RW4.    ASSESSMENT:  CLINICAL IMPRESSION:  Patient arrived today doing some better with RT shldr pain. Rx focused again on ROM progression as well as light strengthening with Green Tband. US  combo and STW  also performed . IFC and HMP end of session. Pt reports decreased pain after session.  OBJECTIVE IMPAIRMENTS: decreased activity tolerance, decreased ROM, decreased strength, postural dysfunction, and pain.  ACTIVITY LIMITATIONS: carrying, lifting, and reach over head  PARTICIPATION LIMITATIONS: occupation and yard work  PERSONAL FACTORS: Time since onset of injury/illness/exacerbation and 1 comorbidity: DM are also affecting patient's functional outcome.   REHAB POTENTIAL: Good  CLINICAL DECISION MAKING: Stable/uncomplicated  EVALUATION COMPLEXITY: Low   GOALS:  SHORT TERM GOALS: Target date: 01/10/24.  Ind with an initial HEP. Goal status: MET   LONG TERM GOALS: Target date: 02/07/24.  Ind with an advanced HEP.  Goal status: On going  2.  Active right shoulder flexion to 155 degrees so the patient can easily reach overhead.  Goal status: On going  3.  Active ER to 75 degrees+ to allow for easily donning/doffing of apparel.  Goal status: On going  4.  Increase right shoulder strength to a solid 4+/5 to increase stability for performance of functional activities.  Goal status: On going 5. Perform ADL's  with pain not > 3/10. Baseline:  Goal status: On going  6.  Improve QUICKDASH score by at least 5 points.  Goal status: On going  PLAN:  PT FREQUENCY: 2x/week  PT DURATION: 6 weeks  PLANNED INTERVENTIONS: 97110-Therapeutic exercises, 97530- Therapeutic activity, [id number]- Neuromuscular re-education, 97535- Self Care, 02859- Manual therapy, G0283- Electrical stimulation (unattended), 97016- Vasopneumatic device, [id number]- Ultrasound, 79439 (1-2 muscles), 20561 (3+ muscles)- Dry Needling, Patient/Family education, Cryotherapy, and Moist heat  PLAN FOR NEXT SESSION: UBE, UE Ranger, PROM, corner stretch, RW4, PRE's.  Modalities as needed.     Jaydan Chretien,CHRIS, PTA 02/14/2024, 9:49 AM   "

## 2024-02-14 NOTE — Addendum Note (Signed)
 Addended by: Demico Ploch W on: 02/14/2024 12:04 PM   Modules accepted: Orders

## 2024-02-21 ENCOUNTER — Ambulatory Visit: Admitting: *Deleted

## 2024-02-28 ENCOUNTER — Ambulatory Visit: Admitting: *Deleted

## 2024-03-06 ENCOUNTER — Ambulatory Visit: Attending: Surgical | Admitting: *Deleted

## 2024-03-06 ENCOUNTER — Encounter: Payer: Self-pay | Admitting: *Deleted

## 2024-03-06 DIAGNOSIS — M25611 Stiffness of right shoulder, not elsewhere classified: Secondary | ICD-10-CM | POA: Insufficient documentation

## 2024-03-06 DIAGNOSIS — M25511 Pain in right shoulder: Secondary | ICD-10-CM | POA: Diagnosis present

## 2024-03-06 DIAGNOSIS — M6281 Muscle weakness (generalized): Secondary | ICD-10-CM | POA: Insufficient documentation

## 2024-03-06 NOTE — Addendum Note (Signed)
 Addended by: Chanae Gemma W on: 03/06/2024 02:20 PM   Modules accepted: Orders

## 2024-03-06 NOTE — Therapy (Addendum)
 " OUTPATIENT PHYSICAL THERAPY SHOULDER TREATMENT    Patient Name: KAZ AULD MRN: [id number] DOB:07-13-1940, 84 y.o., male Today's Date: 03/06/2024  END OF SESSION:  PT End of Session - 03/06/24 0858     Visit Number 7    Number of Visits 12    Date for Recertification  03/06/24    PT Start Time 0845    PT Stop Time 0944    PT Time Calculation (min) 59 min           Past Medical History:  Diagnosis Date   Chronic kidney disease    Hx. of kidney stones   Diabetes mellitus without complication (HCC) 2006ish   Hypertension    Past Surgical History:  Procedure Laterality Date   CYSTOSCOPY WITH RETROGRADE PYELOGRAM, URETEROSCOPY AND STENT PLACEMENT Right 05/19/2014   Procedure: CYSTOSCOPY WITH RETROGRADE PYELOGRAM, RIGHT URETEROSCOPY LASER  AND REMOVED JJ STENT, REPLACED JJ STENT  PLACEMENT;  Surgeon: Arlena Gal, MD;  Location: [facility];  Service: Urology;  Laterality: Right;   CYSTOSCOPY/RETROGRADE/URETEROSCOPY/STONE EXTRACTION WITH BASKET  2007   LITHOTRIPSY     LITHOTRIPSY     LITHOTRIPSY     Patient Active Problem List   Diagnosis Date Noted   Hyperkalemia 05/15/2014   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    THERAPY DIAG:  Acute pain of right shoulder  Stiffness of right shoulder, not elsewhere classified  Muscle weakness (generalized)  Rationale for Evaluation and Treatment: Rehabilitation  ONSET DATE: ~3 months.  SUBJECTIVE:                                                                                                                                                                                      SUBJECTIVE STATEMENT:  Did better last Rx. Might try  injection. Want to keep coming to PT  PERTINENT HISTORY: DM.  PAIN:  Are you having pain? Yes: NPRS scale: 3-4/10. Pain location: Right shoulder. Pain description: Sore. Aggravating factors: Using it. Relieving factors: Not using it.  PRECAUTIONS: None  RED FLAGS: None   WEIGHT BEARING RESTRICTIONS: No  FALLS:  Has patient fallen in last 6 months? Yes. Number of falls foot got caught on something resulting in a fall.  He thinks maybe this started his right shoulder pain.    LIVING ENVIRONMENT: Lives in: House/apartment Has following equipment at home: None  OCCUPATION: Turf grass management.  PLOF: Independent  PATIENT GOALS:Move right shoulder without pain.    NEXT MD VISIT:   OBJECTIVE:   PATIENT SURVEYS:  QUICKDASH:  28 points (38.6%).   POSTURE: Rounded shoulders.  UPPER EXTREMITY ROM:  Active antigravity right shoulder flexion to 145 degrees (left is 170 degrees), behind back to L4, ER is 50 degrees.    UPPER EXTREMITY MMT: Right shoulder IR is 4+/5, ER is 3+/5.  Abduction is 4-/5.    SHOULDER SPECIAL TESTS: Positive right shoulder Impingement testing.    PALPATION:  Tender to palpation over right bicipital groove.                                                                                                                               TREATMENT DATE:  02/14/24:                                      EXERCISE LOG    RT shldr  Exercise Repetitions and Resistance Comments  UBE 10 minutes 90 RPMs   RW4 Green  theraband  3x 10-15 each way  Cues to slow down      UE ranger seated         US  combo x 12 mins 1.5 w/cm2  to RT shldr Manual STW to to lataral and posterior deltoid  HMP and IFC at 80-150 Hz on 40% scan x 15 minutes to patient's right shoulder.     01/10/24:                                      EXERCISE LOG    RT shldr  Exercise Repetitions and Resistance Comments  UBE 10 minutes   RW4 RED theraband  3x 10-15 each way   Cues to slow down  IR was painful  Pulleys 6 minutes   UE ranger seated 6 minutes        HMP and IFC Reviewed and discussed HEP for RT shldr     12/27/23:  HMP and IFC at 80-150 Hz on 40% scan x 20 minutes to patient's right shoulder.   UBE at 120 RPM's x 6 minutes f/b RW4 with yellow theraband.   PATIENT EDUCATION: Education details: RW86.   Person educated: Patient Education method: Explanation, Demonstration, Tactile cues, Verbal cues, and Handouts Education comprehension: verbalized understanding, returned demonstration, verbal cues required, and tactile cues required  HOME EXERCISE PROGRAM: RW4.    ASSESSMENT:  CLINICAL IMPRESSION:  Patient arrived today doing  better with RT shldr pain. Rx focused again on ROM progressions as well as light strengthening with Green Tband. US  combo and STW  also performed again and tolerated well  . IFC and HMP end of session. Pt reports decreased pain after session.  OBJECTIVE IMPAIRMENTS: decreased activity tolerance, decreased  ROM, decreased strength, postural dysfunction, and pain.   ACTIVITY LIMITATIONS: carrying, lifting, and reach over head  PARTICIPATION LIMITATIONS: occupation and yard work  PERSONAL FACTORS: Time since onset of injury/illness/exacerbation and 1 comorbidity: DM are also affecting patient's functional outcome.   REHAB POTENTIAL: Good  CLINICAL DECISION MAKING: Stable/uncomplicated  EVALUATION COMPLEXITY: Low   GOALS:  SHORT TERM GOALS: Target date: 01/10/24.  Ind with an initial HEP. Goal status: MET   LONG TERM GOALS: Target date: 02/07/24.  Ind with an advanced HEP.  Goal status: On going  2.  Active right shoulder flexion to 155 degrees so the patient can easily reach overhead.  Goal status: On going  3.  Active ER to 75 degrees+ to allow for easily donning/doffing of apparel.  Goal status: On going  4.  Increase right shoulder strength to a solid 4+/5 to increase stability for performance  of functional activities.  Goal status: On going 5. Perform ADL's with pain not > 3/10. Baseline:  Goal status: On going  6.  Improve QUICKDASH score by at least 5 points.  Goal status: On going  PLAN:  PT FREQUENCY: 2x/week  PT DURATION: 6 weeks  PLANNED INTERVENTIONS: 97110-Therapeutic exercises, 97530- Therapeutic activity, [id number]- Neuromuscular re-education, 97535- Self Care, 02859- Manual therapy, G0283- Electrical stimulation (unattended), 97016- Vasopneumatic device, [id number]- Ultrasound, 79439 (1-2 muscles), 20561 (3+ muscles)- Dry Needling, Patient/Family education, Cryotherapy, and Moist heat  PLAN FOR NEXT SESSION: UBE, UE Ranger, PROM, corner stretch, RW4, PRE's.  Modalities as needed.     Neftali Abair,CHRIS, PTA 03/06/2024, 12:25 PM   "

## 2024-03-13 ENCOUNTER — Ambulatory Visit: Admitting: *Deleted

## 2024-03-20 ENCOUNTER — Ambulatory Visit: Admitting: *Deleted

## 2024-03-27 ENCOUNTER — Ambulatory Visit: Admitting: *Deleted
